# Patient Record
Sex: Male | Born: 1950 | Race: White | Hispanic: No | Marital: Married | Smoking: Current some day smoker
Health system: Southern US, Community
[De-identification: ages and names within clinical notes are randomized; demographics above are authoritative.]

## PROBLEM LIST (undated history)

## (undated) DIAGNOSIS — I10 Essential (primary) hypertension: Secondary | ICD-10-CM

## (undated) DIAGNOSIS — K5792 Diverticulitis of intestine, part unspecified, without perforation or abscess without bleeding: Secondary | ICD-10-CM

## (undated) DIAGNOSIS — E7800 Pure hypercholesterolemia, unspecified: Secondary | ICD-10-CM

## (undated) DIAGNOSIS — N200 Calculus of kidney: Secondary | ICD-10-CM

## (undated) HISTORY — DX: Calculus of kidney: N20.0

## (undated) HISTORY — DX: Diverticulitis of intestine, part unspecified, without perforation or abscess without bleeding: K57.92

## (undated) HISTORY — PX: APPENDECTOMY: SHX54

## (undated) HISTORY — PX: OTHER SURGICAL HISTORY: SHX169

---

## 2012-11-01 DIAGNOSIS — E785 Hyperlipidemia, unspecified: Secondary | ICD-10-CM | POA: Diagnosis present

## 2013-06-13 ENCOUNTER — Emergency Department (HOSPITAL_BASED_OUTPATIENT_CLINIC_OR_DEPARTMENT_OTHER)
Admission: EM | Admit: 2013-06-13 | Discharge: 2013-06-13 | Disposition: A | Discharge status: Home / Self Care | Payer: BC Managed Care – PPO | Attending: Emergency Medicine | Admitting: Emergency Medicine

## 2013-06-13 ENCOUNTER — Emergency Department (HOSPITAL_BASED_OUTPATIENT_CLINIC_OR_DEPARTMENT_OTHER): Payer: BC Managed Care – PPO

## 2013-06-13 ENCOUNTER — Encounter (HOSPITAL_BASED_OUTPATIENT_CLINIC_OR_DEPARTMENT_OTHER): Payer: Self-pay | Admitting: Emergency Medicine

## 2013-06-13 DIAGNOSIS — Z862 Personal history of diseases of the blood and blood-forming organs and certain disorders involving the immune mechanism: Secondary | ICD-10-CM | POA: Insufficient documentation

## 2013-06-13 DIAGNOSIS — Z880 Allergy status to penicillin: Secondary | ICD-10-CM | POA: Insufficient documentation

## 2013-06-13 DIAGNOSIS — R42 Dizziness and giddiness: Secondary | ICD-10-CM | POA: Insufficient documentation

## 2013-06-13 DIAGNOSIS — R52 Pain, unspecified: Secondary | ICD-10-CM | POA: Insufficient documentation

## 2013-06-13 DIAGNOSIS — R079 Chest pain, unspecified: Secondary | ICD-10-CM

## 2013-06-13 DIAGNOSIS — R51 Headache: Secondary | ICD-10-CM | POA: Insufficient documentation

## 2013-06-13 DIAGNOSIS — R0789 Other chest pain: Secondary | ICD-10-CM | POA: Insufficient documentation

## 2013-06-13 DIAGNOSIS — Z8639 Personal history of other endocrine, nutritional and metabolic disease: Secondary | ICD-10-CM | POA: Insufficient documentation

## 2013-06-13 DIAGNOSIS — R0602 Shortness of breath: Secondary | ICD-10-CM | POA: Insufficient documentation

## 2013-06-13 HISTORY — DX: Essential (primary) hypertension: I10

## 2013-06-13 HISTORY — DX: Pure hypercholesterolemia, unspecified: E78.00

## 2013-06-13 LAB — TROPONIN I: Troponin I: <0.3 ng/mL (ref <0.30)

## 2013-06-13 LAB — COMPREHENSIVE METABOLIC PANEL
ALBUMIN: 3.9 g/dL (ref 3.5–5.2)
ALK PHOS: 64 U/L (ref 39–117)
ALT: 19 U/L (ref 0–53)
AST: 19 U/L (ref 0–37)
BUN: 14 mg/dL (ref 6–23)
CO2: 23 mEq/L (ref 19–32)
CREATININE: 1 mg/dL (ref 0.50–1.35)
Calcium: 9.8 mg/dL (ref 8.4–10.5)
Chloride: 101 mEq/L (ref 96–112)
GFR calc Af Amer: >90 mL/min (ref >90)
GFR calc non Af Amer: 79 mL/min — ABNORMAL LOW (ref >90)
Glucose, Bld: 122 mg/dL — ABNORMAL HIGH (ref 70–99)
POTASSIUM: 4.1 meq/L (ref 3.7–5.3)
Sodium: 140 mEq/L (ref 137–147)
Total Bilirubin: 0.6 mg/dL (ref 0.3–1.2)
Total Protein: 7 g/dL (ref 6.0–8.3)

## 2013-06-13 LAB — CBC
HEMATOCRIT: 44.8 % (ref 39.0–52.0)
HEMOGLOBIN: 15.3 g/dL (ref 13.0–17.0)
MCH: 30.7 pg (ref 26.0–34.0)
MCHC: 34.2 g/dL (ref 30.0–36.0)
MCV: 90 fL (ref 78.0–100.0)
Platelets: 197 10*3/uL (ref 150–400)
RBC: 4.98 MIL/uL (ref 4.22–5.81)
RDW: 13.2 % (ref 11.5–15.5)
WBC: 8.4 10*3/uL (ref 4.0–10.5)

## 2013-06-13 MED ORDER — ASPIRIN 81 MG PO CHEW: 81.0000 mg | CHEWABLE_TABLET | Freq: Every day | ORAL | Status: DC

## 2013-06-13 MED ORDER — LORAZEPAM 1 MG PO TABS
1.0000 mg | ORAL_TABLET | Freq: Once | ORAL | Status: AC
  Administered 2013-06-13: 1 mg via ORAL
  Filled 2013-06-13: qty 1

## 2013-06-13 NOTE — Discharge Instructions (Signed)
As discussed, it is important that you follow up with our cardiologists for further evaluation and management.  In addition, please be sure to speak with your primary care physician.  Chest Pain (Nonspecific) It is often hard to give a specific diagnosis for the cause of chest pain. There is always a chance that your pain could be related to something serious, such as a heart attack or a blood clot in the lungs. You need to follow up with your caregiver for further evaluation. CAUSES   Heartburn.  Pneumonia or bronchitis.  Anxiety or stress.  Inflammation around your heart (pericarditis) or lung (pleuritis or pleurisy).  A blood clot in the lung.  A collapsed lung (pneumothorax). It can develop suddenly on its own (spontaneous pneumothorax) or from injury (trauma) to the chest.  Shingles infection (herpes zoster virus). The chest wall is composed of bones, muscles, and cartilage. Any of these can be the source of the pain.  The bones can be bruised by injury.  The muscles or cartilage can be strained by coughing or overwork.  The cartilage can be affected by inflammation and become sore (costochondritis). DIAGNOSIS  Lab tests or other studies, such as X-rays, electrocardiography, stress testing, or cardiac imaging, may be needed to find the cause of your pain.  TREATMENT   Treatment depends on what may be causing your chest pain. Treatment may include:  Acid blockers for heartburn.  Anti-inflammatory medicine.  Pain medicine for inflammatory conditions.  Antibiotics if an infection is present.  You may be advised to change lifestyle habits. This includes stopping smoking and avoiding alcohol, caffeine, and chocolate.  You may be advised to keep your head raised (elevated) when sleeping. This reduces the chance of acid going backward from your stomach into your esophagus.  Most of the time, nonspecific chest pain will improve within 2 to 3 days with rest and mild pain  medicine. HOME CARE INSTRUCTIONS   If antibiotics were prescribed, take your antibiotics as directed. Finish them even if you start to feel better.  For the next few days, avoid physical activities that bring on chest pain. Continue physical activities as directed.  Do not smoke.  Avoid drinking alcohol.  Only take over-the-counter or prescription medicine for pain, discomfort, or fever as directed by your caregiver.  Follow your caregiver's suggestions for further testing if your chest pain does not go away.  Keep any follow-up appointments you made. If you do not go to an appointment, you could develop lasting (chronic) problems with pain. If there is any problem keeping an appointment, you must call to reschedule. SEEK MEDICAL CARE IF:   You think you are having problems from the medicine you are taking. Read your medicine instructions carefully.  Your chest pain does not go away, even after treatment.  You develop a rash with blisters on your chest. SEEK IMMEDIATE MEDICAL CARE IF:   You have increased chest pain or pain that spreads to your arm, neck, jaw, back, or abdomen.  You develop shortness of breath, an increasing cough, or you are coughing up blood.  You have severe back or abdominal pain, feel nauseous, or vomit.  You develop severe weakness, fainting, or chills.  You have a fever. THIS IS AN EMERGENCY. Do not wait to see if the pain will go away. Get medical help at once. Call your local emergency services (911 in [place] Do not drive yourself to the hospital. MAKE SURE YOU:   Understand these instructions.  Will watch  your condition.  Will get help right away if you are not doing well or get worse. Document Released: 02/21/2005 Document Revised: 08/06/2011 Document Reviewed: 12/18/2007 [facility] Patient Information 2014 [place].  1 return here for concerning changes in your condition.

## 2013-06-13 NOTE — ED Provider Notes (Signed)
CSN: [id number]     Arrival date & time 06/13/13  0944 History   First MD Initiated Contact with Patient 06/13/13 1001     Chief Complaint  Patient presents with  . Chest Pain  . Shortness of Breath  . Headache   (Consider location/radiation/quality/duration/timing/severity/associated sxs/prior Treatment) HPI Patient presents after the sudden onset of chest pressure or dyspnea. Onset was at approximately one hour prior to my evaluation.  Patient recalls that after receiving bad news via telephone he felt the sudden onset of symptoms. In addition to the pressure and dyspnea, there was mild lightheadedness, generalized discomfort. Symptoms have improved nearly entirely prior to my evaluation. Patient did take aspirin. He denies history of heart disease, stroke, blood clots. He does smoke, drink.  Past Medical History  Diagnosis Date  . Hypercholesteremia   . Hypertension    No past surgical history on file. No family history on file. History  Substance Use Topics  . Smoking status: Never Smoker   . Smokeless tobacco: Not on file  . Alcohol Use: Not on file    Review of Systems  Constitutional:       Per HPI, otherwise negative  HENT:       Per HPI, otherwise negative  Respiratory:       Per HPI, otherwise negative  Cardiovascular:       Per HPI, otherwise negative  Gastrointestinal: Negative for vomiting.  Endocrine:       Negative aside from HPI  Genitourinary:       Neg aside from HPI   Musculoskeletal:       Per HPI, otherwise negative  Skin: Negative.   Neurological: Negative for syncope.    Allergies  Penicillins  Home Medications   Current Outpatient Rx  Name  Route  Sig  Dispense  Refill  . ibuprofen (ADVIL,MOTRIN) 600 MG tablet   Oral   Take 600 mg by mouth every 6 (six) hours as needed.          BP 174/87  Pulse 78  Temp(Src) 97.7 F (36.5 C) (Oral)  Resp 16  Ht 5\' 8"  (1.727 m)  Wt 250 lb (113.399 kg)  BMI 38.02 kg/m2  SpO2  100% Physical Exam  Nursing note and vitals reviewed. Constitutional: He is oriented to person, place, and time. He appears well-developed. No distress.  HENT:  Head: Normocephalic and atraumatic.  Eyes: Conjunctivae and EOM are normal.  Cardiovascular: Normal rate and regular rhythm.   Pulmonary/Chest: Effort normal. No stridor. No respiratory distress.  Abdominal: He exhibits no distension.  Musculoskeletal: He exhibits no edema.  Neurological: He is alert and oriented to person, place, and time.  Skin: Skin is warm and dry.  Psychiatric: He has a normal mood and affect.    ED Course  Procedures (including critical care time) Labs Review Labs Reviewed  COMPREHENSIVE METABOLIC PANEL  CBC  TROPONIN I   Imaging Review No results found.  EKG Interpretation    Date/Time:  Saturday June 13 2013 09:50:01 EST Ventricular Rate:  81 PR Interval:  114 QRS Duration: 82 QT Interval:  370 QTC Calculation: 429 R Axis:   96 Text Interpretation:  Normal sinus rhythm Rightward axis Pulmonary disease pattern T wave abnormality, consider lateral ischemia Abnormal ECG Sinus rhythm Rightward axis T wave abnormality Abnormal ekg Confirmed by Carmin Muskrat  MD ([id number]) on 06/13/2013 10:18:05 AM           3:19 PM Patient in no distress.  He is aware  of all results.  We reviewed the x-ray together, will results together, the need for followup together. MDM  No diagnosis found. Patient presents with chest pain that began after receiving bad news.  Patient's symptoms resolved entirely prior to my evaluation, did not recur here.  The patient smokes.  Patient's evaluation here is largely reassuring.  At all times with patient and his wife about the need for further evaluation, management as an outpatient.  Given the absence of distress, the reassuring labs, he was discharged in stable condition to follow up with primary care and cardiology    Carmin Muskrat, MD 06/13/13 1520

## 2013-06-13 NOTE — ED Notes (Signed)
Left sided chest tightness, some sob, some numbness and tingling in hands, started one hour ago after getting bad news.    Denies N/V.  Pt feels very anxious.

## 2014-03-21 ENCOUNTER — Emergency Department (HOSPITAL_BASED_OUTPATIENT_CLINIC_OR_DEPARTMENT_OTHER): Payer: BC Managed Care – PPO

## 2014-03-21 ENCOUNTER — Encounter (HOSPITAL_BASED_OUTPATIENT_CLINIC_OR_DEPARTMENT_OTHER): Payer: Self-pay | Admitting: Emergency Medicine

## 2014-03-21 ENCOUNTER — Emergency Department (HOSPITAL_BASED_OUTPATIENT_CLINIC_OR_DEPARTMENT_OTHER)
Admission: EM | Admit: 2014-03-21 | Discharge: 2014-03-21 | Disposition: A | Discharge status: Home / Self Care | Payer: BC Managed Care – PPO | Attending: Emergency Medicine | Admitting: Emergency Medicine

## 2014-03-21 DIAGNOSIS — Z8639 Personal history of other endocrine, nutritional and metabolic disease: Secondary | ICD-10-CM | POA: Diagnosis not present

## 2014-03-21 DIAGNOSIS — J029 Acute pharyngitis, unspecified: Secondary | ICD-10-CM | POA: Diagnosis not present

## 2014-03-21 DIAGNOSIS — Z720 Tobacco use: Secondary | ICD-10-CM | POA: Diagnosis not present

## 2014-03-21 DIAGNOSIS — R599 Enlarged lymph nodes, unspecified: Secondary | ICD-10-CM

## 2014-03-21 DIAGNOSIS — I10 Essential (primary) hypertension: Secondary | ICD-10-CM | POA: Diagnosis not present

## 2014-03-21 DIAGNOSIS — Z880 Allergy status to penicillin: Secondary | ICD-10-CM | POA: Insufficient documentation

## 2014-03-21 DIAGNOSIS — R52 Pain, unspecified: Secondary | ICD-10-CM

## 2014-03-21 DIAGNOSIS — K088 Other specified disorders of teeth and supporting structures: Secondary | ICD-10-CM | POA: Diagnosis present

## 2014-03-21 DIAGNOSIS — Z7982 Long term (current) use of aspirin: Secondary | ICD-10-CM | POA: Diagnosis not present

## 2014-03-21 DIAGNOSIS — R221 Localized swelling, mass and lump, neck: Secondary | ICD-10-CM

## 2014-03-21 LAB — CBC WITH DIFFERENTIAL/PLATELET
BASOS ABS: 0 10*3/uL (ref 0.0–0.1)
BASOS PCT: 0 % (ref 0–1)
Eosinophils Absolute: 0.1 10*3/uL (ref 0.0–0.7)
Eosinophils Relative: 1 % (ref 0–5)
HEMATOCRIT: 46 % (ref 39.0–52.0)
HEMOGLOBIN: 15.6 g/dL (ref 13.0–17.0)
LYMPHS PCT: 11 % — AB (ref 12–46)
Lymphs Abs: 1.3 10*3/uL (ref 0.7–4.0)
MCH: 31.1 pg (ref 26.0–34.0)
MCHC: 33.9 g/dL (ref 30.0–36.0)
MCV: 91.6 fL (ref 78.0–100.0)
MONO ABS: 0.9 10*3/uL (ref 0.1–1.0)
MONOS PCT: 8 % (ref 3–12)
Neutro Abs: 9.7 10*3/uL — ABNORMAL HIGH (ref 1.7–7.7)
Neutrophils Relative %: 80 % — ABNORMAL HIGH (ref 43–77)
Platelets: 182 10*3/uL (ref 150–400)
RBC: 5.02 MIL/uL (ref 4.22–5.81)
RDW: 13.5 % (ref 11.5–15.5)
WBC: 12.1 10*3/uL — AB (ref 4.0–10.5)

## 2014-03-21 LAB — BASIC METABOLIC PANEL
Anion gap: 14 (ref 5–15)
BUN: 18 mg/dL (ref 6–23)
CHLORIDE: 104 meq/L (ref 96–112)
CO2: 23 meq/L (ref 19–32)
Calcium: 10.4 mg/dL (ref 8.4–10.5)
Creatinine, Ser: 0.8 mg/dL (ref 0.50–1.35)
GFR calc non Af Amer: >90 mL/min (ref >90)
Glucose, Bld: 133 mg/dL — ABNORMAL HIGH (ref 70–99)
Potassium: 4.4 mEq/L (ref 3.7–5.3)
SODIUM: 141 meq/L (ref 137–147)

## 2014-03-21 LAB — RAPID STREP SCREEN (MED CTR MEBANE ONLY): STREPTOCOCCUS, GROUP A SCREEN (DIRECT): NEGATIVE

## 2014-03-21 MED ORDER — IOHEXOL 300 MG/ML  SOLN: 100.0000 mL | Freq: Once | INTRAMUSCULAR | Status: DC | PRN

## 2014-03-21 MED ORDER — FENTANYL CITRATE 0.05 MG/ML IJ SOLN
50.0000 ug | Freq: Once | INTRAMUSCULAR | Status: AC
  Administered 2014-03-21: 50 ug via INTRAVENOUS
  Filled 2014-03-21: qty 2

## 2014-03-21 MED ORDER — KETOROLAC TROMETHAMINE 30 MG/ML IJ SOLN
30.0000 mg | Freq: Once | INTRAMUSCULAR | Status: AC
  Administered 2014-03-21: 30 mg via INTRAVENOUS
  Filled 2014-03-21: qty 1

## 2014-03-21 MED ORDER — CLINDAMYCIN PHOSPHATE 600 MG/50ML IV SOLN
600.0000 mg | Freq: Once | INTRAVENOUS | Status: AC
  Administered 2014-03-21: 600 mg via INTRAVENOUS
  Filled 2014-03-21: qty 50

## 2014-03-21 MED ORDER — CLINDAMYCIN HCL 300 MG PO CAPS: 300.0000 mg | ORAL_CAPSULE | Freq: Four times a day (QID) | ORAL | Status: DC

## 2014-03-21 MED ORDER — HYDRALAZINE HCL 20 MG/ML IJ SOLN
2.0000 mg | Freq: Once | INTRAMUSCULAR | Status: AC
  Administered 2014-03-21: 2 mg via INTRAVENOUS
  Filled 2014-03-21: qty 1

## 2014-03-21 MED ORDER — DEXAMETHASONE SODIUM PHOSPHATE 4 MG/ML IJ SOLN
10.0000 mg | Freq: Once | INTRAMUSCULAR | Status: AC
  Administered 2014-03-21: 10 mg via INTRAVENOUS
  Filled 2014-03-21: qty 3

## 2014-03-21 MED ORDER — HYDROCODONE-ACETAMINOPHEN 7.5-325 MG/15ML PO SOLN: 15.0000 mL | Freq: Four times a day (QID) | ORAL | Status: AC | PRN

## 2014-03-21 NOTE — ED Notes (Signed)
Pt reports nasal dripand cough that lead to oral swelling and pain 10/10

## 2014-03-21 NOTE — ED Provider Notes (Signed)
Care was taken over from Dr. Nicholes Stairs. Patient presented with sore throat and difficulty swallowing with swelling to the left side of his neck. Patient and his wife did agree to get the CT scan. This was done noncontrast given that he has an IV contrast allergy. The noncontrast CT showed some edema in the tonsillar area extending to the arytenoid. There is no airway compromise. These findings were discussed with the radiologist. There is no definite abscess collection. Patient is well-appearing at this point. His voice is at baseline per report. He has no difficulty controlling secretions. He was given a dose of IV clindamycin in the ED. He previously been given Decadron. I feel comfortable with him going home at this point. He was discharged and advised to follow-up with ENT, Dr. Constance Holster if his symptoms are not improving in next 1-2 days. I advised him return to the emergency department if he has any worsening throat swelling or difficulty swallowing. He is given prescriptions for clindamycin as well as Lortab elixir.  Results for orders placed during the hospital encounter of 03/21/14  RAPID STREP SCREEN      Result Value Ref Range   Streptococcus, Group A Screen (Direct) NEGATIVE  NEGATIVE  BASIC METABOLIC PANEL      Result Value Ref Range   Sodium 141  137 - 147 mEq/L   Potassium 4.4  3.7 - 5.3 mEq/L   Chloride 104  96 - 112 mEq/L   CO2 23  19 - 32 mEq/L   Glucose, Bld 133 (*) 70 - 99 mg/dL   BUN 18  6 - 23 mg/dL   Creatinine, Ser 0.80  0.50 - 1.35 mg/dL   Calcium 10.4  8.4 - 10.5 mg/dL   GFR calc non Af Amer >90  >90 mL/min   GFR calc Af Amer >90  >90 mL/min   Anion gap 14  5 - 15  CBC WITH DIFFERENTIAL      Result Value Ref Range   WBC 12.1 (*) 4.0 - 10.5 K/uL   RBC 5.02  4.22 - 5.81 MIL/uL   Hemoglobin 15.6  13.0 - 17.0 g/dL   HCT 46.0  39.0 - 52.0 %   MCV 91.6  78.0 - 100.0 fL   MCH 31.1  26.0 - 34.0 pg   MCHC 33.9  30.0 - 36.0 g/dL   RDW 13.5  11.5 - 15.5 %   Platelets 182  150 -  400 K/uL   Neutrophils Relative % 80 (*) 43 - 77 %   Neutro Abs 9.7 (*) 1.7 - 7.7 K/uL   Lymphocytes Relative 11 (*) 12 - 46 %   Lymphs Abs 1.3  0.7 - 4.0 K/uL   Monocytes Relative 8  3 - 12 %   Monocytes Absolute 0.9  0.1 - 1.0 K/uL   Eosinophils Relative 1  0 - 5 %   Eosinophils Absolute 0.1  0.0 - 0.7 K/uL   Basophils Relative 0  0 - 1 %   Basophils Absolute 0.0  0.0 - 0.1 K/uL   Ct Soft Tissue Neck Wo Contrast  03/21/2014   CLINICAL DATA:  Throat swelling. Left-sided neck swelling. Pain. Elevated white count. Allergy to IV contrast.  EXAM: CT NECK WITHOUT CONTRAST  TECHNIQUE: Multidetector CT imaging of the neck was performed following the standard protocol without intravenous contrast.  COMPARISON:  None.  FINDINGS: The left palatine tonsil region is enlarged and inflamed. There are inflammatory changes extending into the adjacent fat planes including the parapharyngeal  fat. No discrete mass is evident. Edematous changes extend into the left vallecula and aryepiglottic fold. The airway is patent there shifted slightly to the right. The vallecula and right-sided the epiglottis are normal. The left submandibular gland is slightly hyperdense to the right with some inflammatory change. This is likely secondary. No obstructing stones are present.  The parotid glands are within normal limits bilaterally.  The larynx is within normal limits. Vocal cords are midline and symmetric. No focal mucosal or submucosal lesions are present. The cartilage is intact.  The thyroid is normal. Trachea esophagus are within normal limits. The superior mediastinum is unremarkable.  Reactive sized level 2 lymph nodes are more prominent on the left.  Although dental caries are present within the left mandibular teeth, there no significant periapical disease or osseous erosion. The bone windows demonstrate mild endplate degenerative changes at C4-5, C5-6, and C6-7 with some uncovertebral spurring at each of these levels.  Multilevel facet degenerative changes are noted as well.  IMPRESSION: 1. Inflammatory changes of the left palatine tonsil compatible with acute tonsillitis. 2. The tonsil is enlarged with areas of hypoattenuation. No discrete abscess is evident. Sensitivity is decreased without IV contrast. 3. Inflammatory changes surrounding the left tonsil with edematous changes extending into the left vallecula and aryepiglottic fold. No discrete mass is present. 4. Asymmetric density inflammatory change of the left submandibular gland is likely secondary. No focal duct dilation or stones are evident. 5. Left level 2 lymph nodes appear reactive.  These results were called by telephone at the time of interpretation on 03/21/2014 at 10:33 am to Dr. Threasa Beards Zelina Jimerson , who verbally acknowledged these results.   Electronically Signed   By: Lawrence Santiago M.D.   On: 03/21/2014 10:33      Malvin Johns, MD 03/21/14 1157

## 2014-03-21 NOTE — ED Provider Notes (Signed)
CSN: [id number]     Arrival date & time 03/21/14  1287 History   None    Chief Complaint  Patient presents with  . Oral Swelling     (Consider location/radiation/quality/duration/timing/severity/associated sxs/prior Treatment) Patient is a 63 y.o. male presenting with tooth pain. The history is provided by the patient. No language interpreter was used.  Dental Pain Location:  Lower Quality:  Aching Severity:  Severe Onset quality:  Gradual Timing:  Constant Progression:  Worsening Chronicity:  New Context: cap still on   Context comment:  Started as tongue pain and submandibular pain on the left and then pain in the lower jaw and teeth Previous work-up:  Dental exam and filled cavity Relieved by:  Nothing Worsened by:  Nothing tried Ineffective treatments:  None tried Associated symptoms: no congestion, no drooling, no facial swelling, no oral bleeding, no oral lesions and no trismus   Risk factors: smoking   Risk factors: no alcohol problem     Past Medical History  Diagnosis Date  . Hypercholesteremia   . Hypertension    Past Surgical History  Procedure Laterality Date  . Appendectomy     History reviewed. No pertinent family history. History  Substance Use Topics  . Smoking status: Current Some Day Smoker  . Smokeless tobacco: Never Used  . Alcohol Use: Yes    Review of Systems  HENT: Negative for congestion, drooling, facial swelling and mouth sores.   Cardiovascular: Negative for chest pain.  All other systems reviewed and are negative.     Allergies  Penicillins  Home Medications   Prior to Admission medications   Medication Sig Start Date End Date Taking? Authorizing Provider  aspirin 81 MG chewable tablet Chew 1 tablet (81 mg total) by mouth daily. 06/13/13   Carmin Muskrat, MD  ibuprofen (ADVIL,MOTRIN) 600 MG tablet Take 600 mg by mouth every 6 (six) hours as needed.    Historical Provider, MD   BP 202/112  Pulse 82  Temp(Src) 97.8 F (36.6  C) (Oral)  Resp 20  SpO2 97% Physical Exam  Constitutional: He is oriented to person, place, and time. He appears well-developed and well-nourished. No distress.  No drooling no trismus laying flat in the bed without difficulty.  No plumy voice.  Intact phonation  HENT:  Head: Normocephalic and atraumatic.  Mouth/Throat: Oropharynx is clear and moist. No oropharyngeal exudate.  No swelling of the lips or tongue or floor of the mouth.  Caries in the LL molars,  Swelling of the salivary  Eyes: Conjunctivae and EOM are normal. Pupils are equal, round, and reactive to light.  Neck: Normal range of motion. Neck supple. No tracheal deviation present.  No pain with displacement of the trachea.    Cardiovascular: Normal rate, regular rhythm and intact distal pulses.   Pulmonary/Chest: Effort normal and breath sounds normal. No stridor. No respiratory distress. He has no wheezes. He has no rales.  Abdominal: Soft. Bowel sounds are normal. There is no tenderness. There is no rebound and no guarding.  Musculoskeletal: Normal range of motion.  Lymphadenopathy:    He has no cervical adenopathy.  Neurological: He is alert and oriented to person, place, and time.  Skin: Skin is warm and dry.  Psychiatric: He has a normal mood and affect.    ED Course  Procedures (including critical care time) Labs Review Labs Reviewed  RAPID STREP SCREEN  CBC WITH DIFFERENTIAL  BASIC METABOLIC PANEL    Imaging Review No results found.  EKG Interpretation None      MDM   Final diagnoses:  Swelling of gland  Per records patient has a h/o HTN  Medications  iohexol (OMNIPAQUE) 300 MG/ML solution 100 mL (not administered)  dexamethasone (DECADRON) injection 10 mg (10 mg Intravenous Given 03/21/14 0641)  ketorolac (TORADOL) 30 MG/ML injection 30 mg (30 mg Intravenous Given 03/21/14 9628)    Case d/w Dr. Weber Cooks.  Originally recommended 13 hour prep and CT with.  Family refusing Ct with contrast  even with prep due to anaphylactic reaction to IV contrast 40 years ago.  Patient's nurse Sunday Spillers present for entirety of the discussion.  Family would prefer MRI.  Case d/w Dr. Weber Cooks.  MRI is a suboptimal test.     Family is still deciding with patient's sister who is a Marine scientist on the phone.  Rechecking vitals.  Have medicated BP.     Have signed out to Dr. Tamera Punt pending CT or family decision   If patient declines CT scan will need to [facility] as the risks of not having the test are death from airway compromise, death from infection   2022/09/25 Alfonso Patten, MD 03/21/14 [phone]

## 2014-03-21 NOTE — ED Notes (Addendum)
Patient reports having an anaphylactic reaction to IV contrast in the past. Patient has been given the options per the EDP and [facility] radiologist for CT w/o contrast vs having a several hour pre-procedure prep vs leaving AMA. The family advised that the patient needs an MRI and not to have a CT scan done at all. The family and the patient  is currently working towards making the decision on what they would like to do.

## 2014-03-21 NOTE — Discharge Instructions (Signed)

## 2014-03-23 LAB — CULTURE, GROUP A STREP

## 2014-07-19 DIAGNOSIS — I10 Essential (primary) hypertension: Secondary | ICD-10-CM | POA: Diagnosis present

## 2014-07-20 DIAGNOSIS — E291 Testicular hypofunction: Secondary | ICD-10-CM | POA: Insufficient documentation

## 2014-09-01 DIAGNOSIS — D751 Secondary polycythemia: Secondary | ICD-10-CM | POA: Insufficient documentation

## 2015-11-21 DIAGNOSIS — Z8619 Personal history of other infectious and parasitic diseases: Secondary | ICD-10-CM | POA: Insufficient documentation

## 2016-02-23 IMAGING — CT CT NECK W/O CM
3 of 5 series · 13 of 35 positions shown, 16 images · non-contrast
Comparison: None.

CLINICAL DATA: Throat swelling. Left-sided neck swelling. Pain.
Elevated white count. Allergy to IV contrast.

EXAM:
CT NECK WITHOUT CONTRAST
TECHNIQUE: Multidetector CT imaging of the neck was performed following the
standard protocol without intravenous contrast.

[Series 4: neck 2.0 coronal · coronal · 0.45mm/px · 3 of 98 slices shown]
[im 20/98  bone]
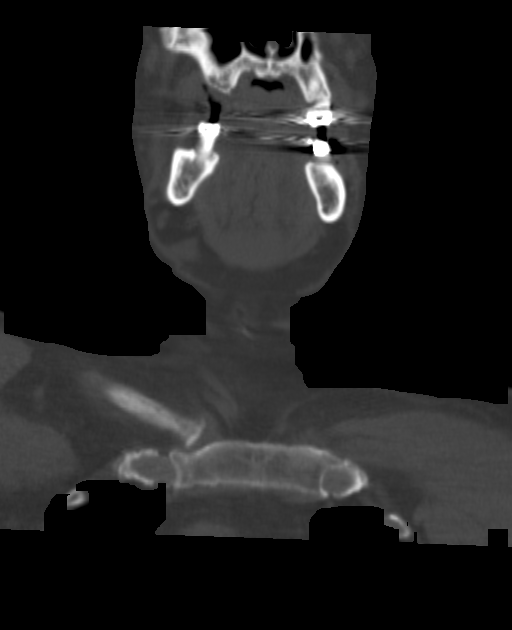
[im 39/98  bone]
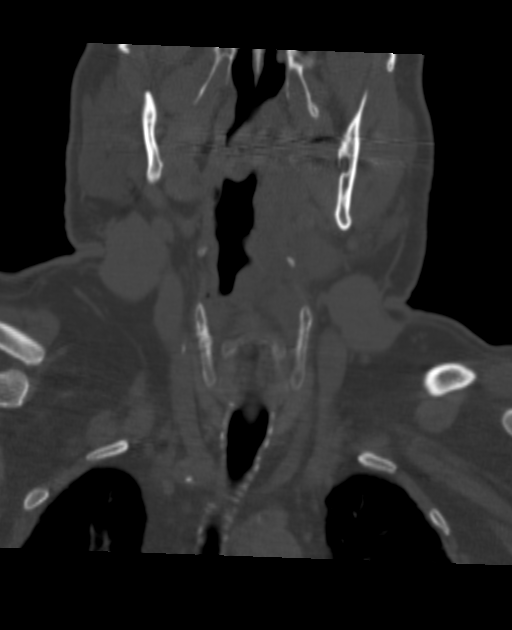
[im 59/98  bone]
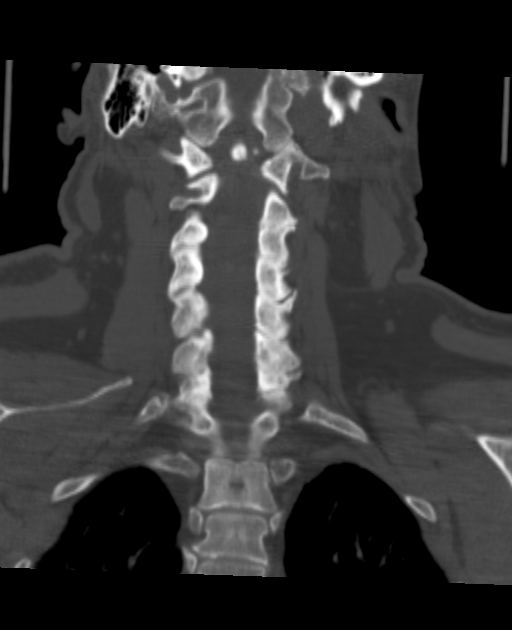

[Series 5: neck 2.0 sagittal · sagittal · 0.45mm/px · 5 of 101 slices shown, 6 images]
[im 34/101  bone]
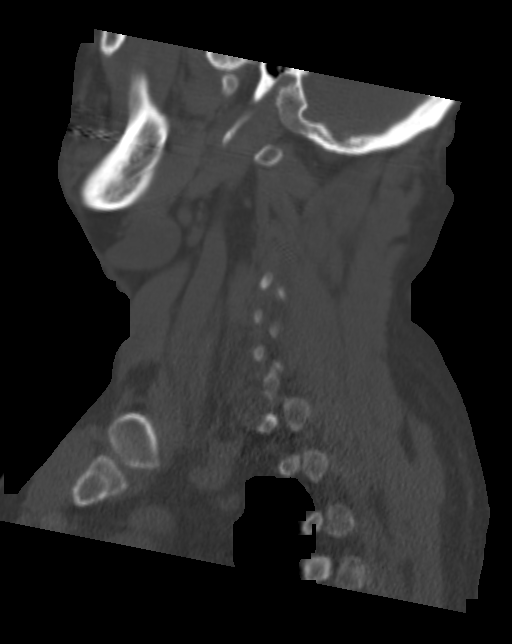
[im 42/101  bone]
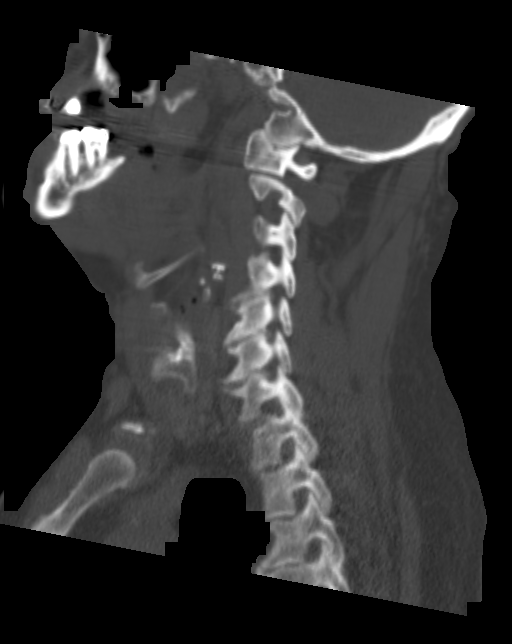
[im 51/101  soft-tissue]
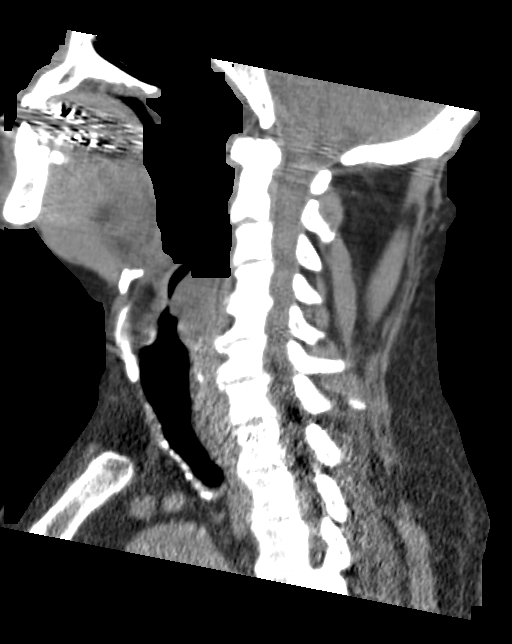
[im 51/101  bone]
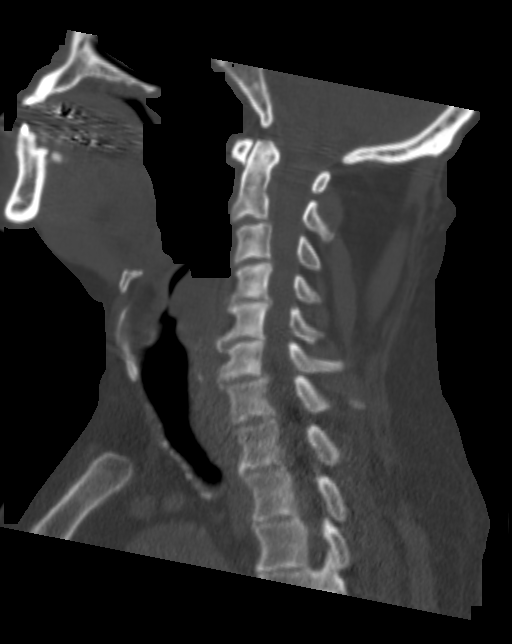
[im 59/101  bone]
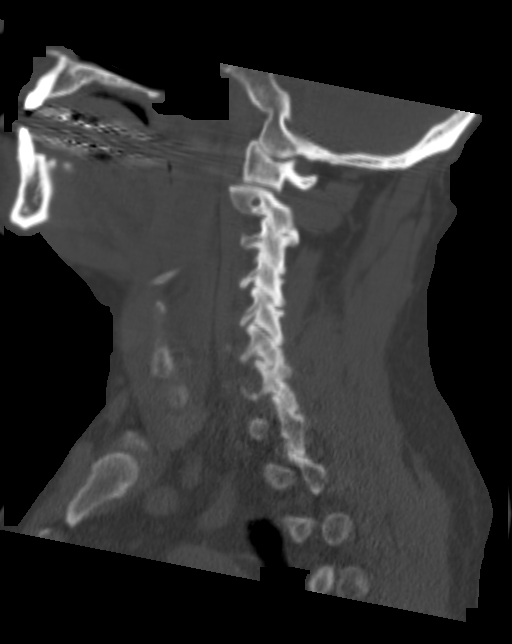
[im 67/101  bone]
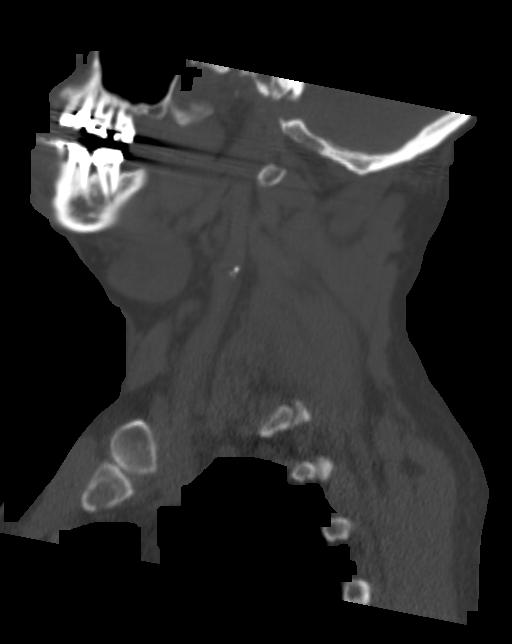

[Series 7: neck 2.0 orth axial to hyoid · axial · 0.39mm/px · z∈[+1028,+1175]mm · 5 of 116 slices shown, 7 images]
[im 20/116  soft-tissue]
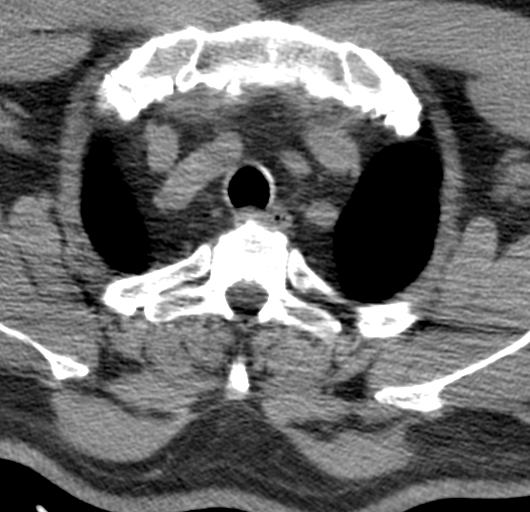
[im 20/116  bone]
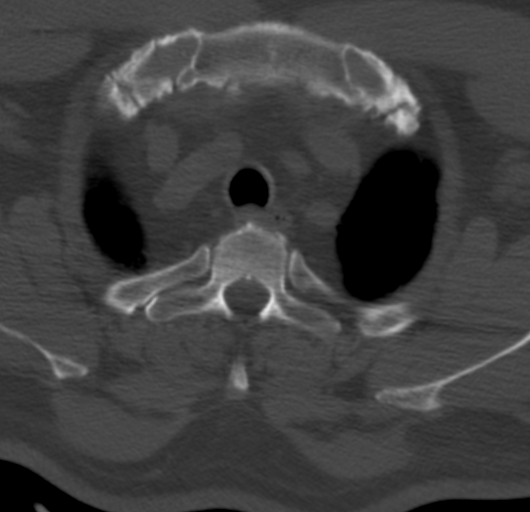
[im 39/116  bone]
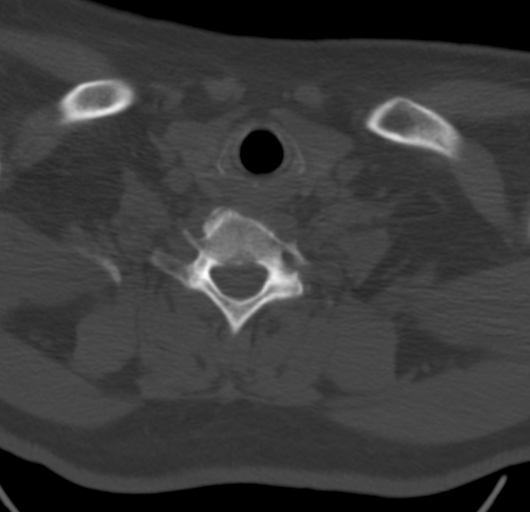
[im 58/116  bone]
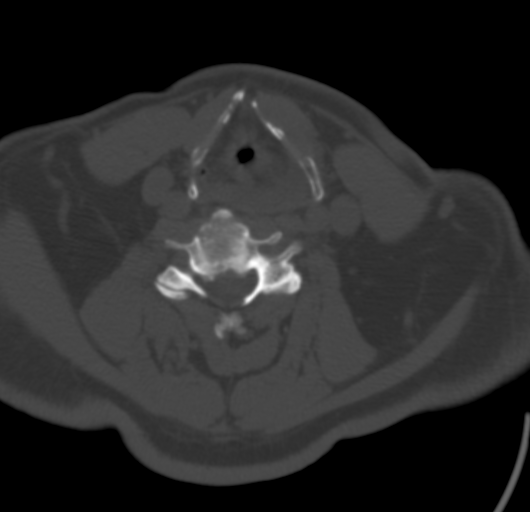
[im 77/116  bone]
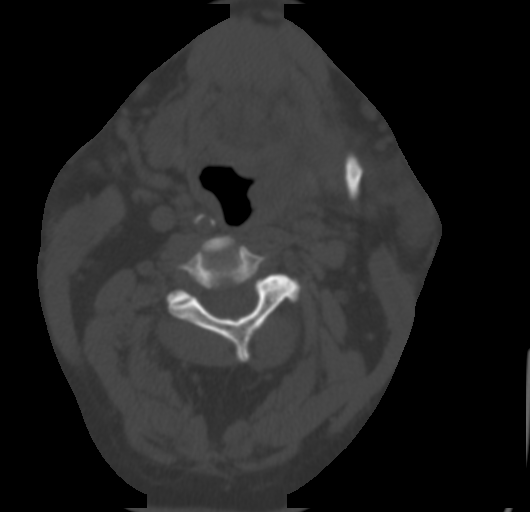
[im 96/116  soft-tissue]
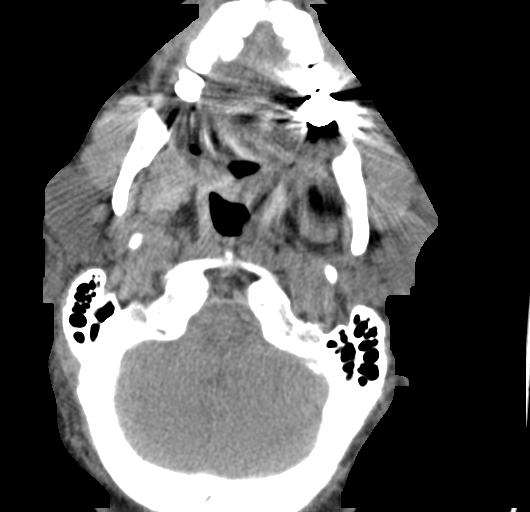
[im 96/116  bone]
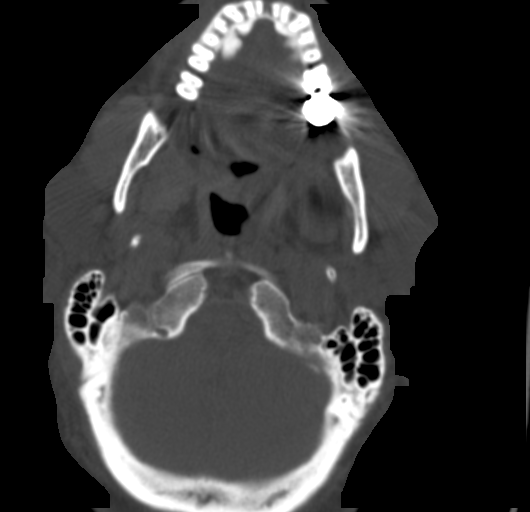

[13 of 35 positions shown; findings below may reference images not displayed]

FINDINGS: The left palatine tonsil region is enlarged and inflamed. There are
inflammatory changes extending into the adjacent fat planes
including the parapharyngeal fat. No discrete mass is evident.
Edematous changes extend into the left vallecula and aryepiglottic
fold. The airway is patent there shifted slightly to the right. The
vallecula and right-sided the epiglottis are normal. The left
submandibular gland is slightly hyperdense to the right with some
inflammatory change. This is likely secondary. No obstructing stones
are present.

The parotid glands are within normal limits bilaterally.

The larynx is within normal limits. Vocal cords are midline and
symmetric. No focal mucosal or submucosal lesions are present. The
cartilage is intact.

The thyroid is normal. Trachea esophagus are within normal limits.
The superior mediastinum is unremarkable.

Reactive sized level 2 lymph nodes are more prominent on the left.

Although dental caries are present within the left mandibular teeth,
there no significant periapical disease or osseous erosion. The bone
windows demonstrate mild endplate degenerative changes at C4-5,
C5-6, and C6-7 with some uncovertebral spurring at each of these
levels. Multilevel facet degenerative changes are noted as well.
IMPRESSION: 1. Inflammatory changes of the left palatine tonsil compatible with
acute tonsillitis.
2. The tonsil is enlarged with areas of hypoattenuation. No discrete
abscess is evident. Sensitivity is decreased without IV contrast.
3. Inflammatory changes surrounding the left tonsil with edematous
changes extending into the left vallecula and aryepiglottic fold. No
discrete mass is present.
4. Asymmetric density inflammatory change of the left submandibular
gland is likely secondary. No focal duct dilation or stones are
evident.
5. Left level 2 lymph nodes appear reactive.

These results were called by telephone at the time of interpretation
on 03/21/2014 at [DATE] to Dr. DAO CEBREROS , who verbally
acknowledged these results.

## 2016-12-26 DIAGNOSIS — R7303 Prediabetes: Secondary | ICD-10-CM | POA: Diagnosis present

## 2019-07-06 DIAGNOSIS — N200 Calculus of kidney: Secondary | ICD-10-CM | POA: Insufficient documentation

## 2019-07-06 DIAGNOSIS — K5792 Diverticulitis of intestine, part unspecified, without perforation or abscess without bleeding: Secondary | ICD-10-CM | POA: Insufficient documentation

## 2019-07-06 DIAGNOSIS — K5730 Diverticulosis of large intestine without perforation or abscess without bleeding: Secondary | ICD-10-CM | POA: Insufficient documentation

## 2019-07-14 ENCOUNTER — Encounter: Payer: Self-pay | Admitting: Gastroenterology

## 2019-07-19 ENCOUNTER — Ambulatory Visit: Payer: Medicare HMO | Attending: Internal Medicine

## 2019-07-19 DIAGNOSIS — Z23 Encounter for immunization: Secondary | ICD-10-CM | POA: Insufficient documentation

## 2019-07-19 NOTE — Progress Notes (Signed)
   Covid-19 Vaccination Clinic  Name:  Steven Fisher    MRN: [id number] DOB: [birth year]  07/19/2019  [name] was observed post Covid-19 immunization for 30 minutes based on pre-vaccination screening without incidence. He was provided with Vaccine Information Sheet and instruction to access the V-Safe system.   [name] was instructed to call 911 with any severe reactions post vaccine: Marland Kitchen Difficulty breathing  . Swelling of your face and throat  . A fast heartbeat  . A bad rash all over your body  . Dizziness and weakness    Immunizations Administered    Name Date Dose VIS Date Route   Pfizer COVID-19 Vaccine 07/19/2019  8:29 AM 0.3 mL 05/08/2019 Intramuscular   Manufacturer: [place]   Lot: [id number]   [place]: [id number]

## 2019-08-03 ENCOUNTER — Other Ambulatory Visit: Payer: Self-pay

## 2019-08-10 ENCOUNTER — Other Ambulatory Visit: Payer: Self-pay

## 2019-08-10 ENCOUNTER — Ambulatory Visit: Payer: Medicare HMO | Admitting: Gastroenterology

## 2019-08-10 ENCOUNTER — Encounter: Payer: Self-pay | Admitting: Gastroenterology

## 2019-08-10 VITALS — BP 130/86 | HR 73 | Temp 97.6°F | Ht 68.0 in | Wt 190.0 lb

## 2019-08-10 DIAGNOSIS — K5732 Diverticulitis of large intestine without perforation or abscess without bleeding: Secondary | ICD-10-CM

## 2019-08-10 DIAGNOSIS — K5900 Constipation, unspecified: Secondary | ICD-10-CM | POA: Diagnosis not present

## 2019-08-10 DIAGNOSIS — K649 Unspecified hemorrhoids: Secondary | ICD-10-CM

## 2019-08-10 MED ORDER — SUPREP BOWEL PREP KIT 17.5-3.13-1.6 GM/177ML PO SOLN: ORAL | 0 refills | Status: DC

## 2019-08-10 MED ORDER — POLYETHYLENE GLYCOL 3350 17 G PO PACK: 17.0000 g | PACK | Freq: Every day | ORAL | 0 refills | Status: AC

## 2019-08-10 NOTE — Patient Instructions (Addendum)
If you are age 68 or older, your body mass index should be between 23-30. Your Body mass index is 28.89 kg/m. If this is out of the aforementioned range listed, please consider follow up with your Primary Care Provider.  If you are age 40 or younger, your body mass index should be between 19-25. Your Body mass index is 28.89 kg/m. If this is out of the aformentioned range listed, please consider follow up with your Primary Care Provider.   You have been scheduled for a colonoscopy. Please follow written instructions given to you at your visit today.  Please pick up your prep supplies at the pharmacy within the next 1-3 days. If you use inhalers (even only as needed), please bring them with you on the day of your procedure. Your physician has requested that you go to www.startemmi.com and enter the access code given to you at your visit today. This web site gives a general overview about your procedure. However, you should still follow specific instructions given to you by our office regarding your preparation for the procedure.  Please purchase the following medications over the counter and take as directed: Miralax : Take daily as directed  Thank you for entrusting me with your care and for choosing [place], Dr. Buckingham Cellar   Due to recent changes in healthcare laws, you may see the results of your imaging and laboratory studies on MyChart before your provider has had a chance to review them.  We understand that in some cases there may be results that are confusing or concerning to you. Not all laboratory results come back in the same time frame and the provider may be waiting for multiple results in order to interpret others.  Please give [place] 48 hours in order for your provider to thoroughly review all the results before contacting the office for clarification of your results.

## 2019-08-10 NOTE — Progress Notes (Signed)
HPI :  69 year old male with a history of hypertension, hypercholesterolemia, suspected diverticulitis, referred here by Laurel Dimmer MD for diverticulitis.  On February 8 the patient was seen for severe acute onset left lower quadrant pain.  Patient states this felt unlike anything he has experienced before.  He has a history of renal stones but this apparently was much different from that and severe.  CT scan showed diverticulitis of the distal descending colon.  He incidentally had renal stones and spinal stenosis.  He was given some antibiotics and eventually his symptoms improved.  He states he has had no pain in recent weeks.  This is the first time he is ever had diverticulitis before.  His last colonoscopy was in 2018 and report of scattered rare diverticula were noted.  No adenomas or precancerous polyps on that exam.  He says at baseline he has hard stools and constipation that really bothers him and aggravates his hemorrhoids.  He does not like taking medication so he does not take much for this.  He was given some MiraLAX but states it is in his house but he does not use it.  He has grade 2-3 hemorrhoids that bother him.  He has a lot of discomfort with bowel movements after words, and some occasional bleeding.  He is interested in pursuing some type of therapy for the hemorrhoids.  He has no family history of colon cancer.    CT abdomen pelvis 07/06/19 - diverticulitis of the distal descending colon, left renal stones, spinal stenosis,   Colonoscopy [facility] Date of Surgery: 11/21/2016  Pre-op Diagnosis: History of colon polyps  Key Findings: Benign-appearing colorectal polyps. Rare scattered diverticulosis without inflammation. Minimal complex hemorrhoids with several hypertrophied anal papillae and perianal epithelial cholesterol deposits.   Path - hyperplastic polyps    Past Medical History:  Diagnosis Date  . Diverticulitis   . Hypercholesteremia   .  Hypertension   . Renal stones      Past Surgical History:  Procedure Laterality Date  . APPENDECTOMY    . orthopedic surgeries     multiple   Family History  Problem Relation Age of Onset  . Heart attack Maternal Grandfather   . Heart disease Paternal Grandfather    Social History   Tobacco Use  . Smoking status: Current Some Day Smoker  . Smokeless tobacco: Never Used  Substance Use Topics  . Alcohol use: Yes  . Drug use: No   Current Outpatient Medications  Medication Sig Dispense Refill  . hydrochlorothiazide (HYDRODIURIL) 25 MG tablet Take by mouth.    Marland Kitchen LORazepam (ATIVAN) 1 MG tablet Take by mouth.    . losartan (COZAAR) 100 MG tablet Take 100 mg by mouth daily.    Manus Gunning BOWEL PREP KIT 17.5-3.13-1.6 GM/177ML SOLN Suprep-Use as directed 354 mL 0   No current facility-administered medications for this visit.   Allergies  Allergen Reactions  . Iohexol Anaphylaxis    Had contrast x 20 years ago, throat closed up  . Penicillins Anaphylaxis     Review of Systems: All systems reviewed and negative except where noted in HPI.   Lab Results  Component Value Date   WBC 12.1 (H) 03/21/2014   HGB 15.6 03/21/2014   HCT 46.0 03/21/2014   MCV 91.6 03/21/2014   PLT 182 03/21/2014    Lab Results  Component Value Date   CREATININE 0.80 03/21/2014   BUN 18 03/21/2014   NA 141 03/21/2014   K  4.4 03/21/2014   CL 104 03/21/2014   CO2 23 03/21/2014    Lab Results  Component Value Date   ALT 19 06/13/2013   AST 19 06/13/2013   ALKPHOS 64 06/13/2013   BILITOT 0.6 06/13/2013     Physical Exam: BP 130/86   Pulse 73   Temp 97.6 F (36.4 C)   Ht 5' 8"  (1.727 m)   Wt 190 lb (86.2 kg)   BMI 28.89 kg/m  Constitutional: Pleasant,well-developed, male in no acute distress. HEENT: Normocephalic and atraumatic. Conjunctivae are normal. No scleral icterus. Neck supple.  Cardiovascular: Normal rate, regular rhythm.  Pulmonary/chest: Effort normal and breath  sounds normal. No wheezing, rales or rhonchi. Abdominal: Soft, nondistended, nontender. There are no masses palpable.  Extremities: no edema Lymphadenopathy: No cervical adenopathy noted. Neurological: Alert and oriented to person place and time. Skin: Skin is warm and dry. No rashes noted. Psychiatric: Normal mood and affect. Behavior is normal.   ASSESSMENT AND PLAN: 69 year old male here for new patient assessment of the following:  Diverticulitis - CT findings concerning for descending colon diverticulitis, his symptoms also fit with this and have resolved with antibiotics.  This is the most likely diagnosis however his last colonoscopy was almost 3 years ago.  Based off of current recommendations given its been more than a year since his last colonoscopy, recommend a colonoscopy 6 to 8 weeks after this episode to ensure no interval development since his last exam.  I discussed risk benefits of colonoscopy and anesthesia with him and he wished to proceed.  Further recommendations pending results.  If he has any recurrence of symptoms in the interim I asked him to contact me.  He agreed  Constipation - recommend a bowel regimen daily given ongoing chronic symptoms of this and hemorrhoids.  Recommend MiraLAX daily and titrate up or down as needed.  He was agreeable to this after discussion of options.  Internal hemorrhoids - he may be a good candidate for hemorrhoid banding, he wants to avoid surgery if possible.  He wishes to await his colonoscopy results and then will discuss management of his hemorrhoids.  Treating constipation will certainly help as above.  If he is a candidate based off colonoscopy, will consider banding.  He agreed  Avera Cellar, MD [facility]  CC: Seth Bake Danella Deis, MD

## 2019-08-12 ENCOUNTER — Ambulatory Visit: Payer: Medicare HMO | Attending: Internal Medicine

## 2019-08-12 DIAGNOSIS — Z23 Encounter for immunization: Secondary | ICD-10-CM

## 2019-08-12 NOTE — Progress Notes (Signed)
   [id number]  Name:  Steven Fisher.    MRN: [id number] DOB: [birth year]  08/12/2019  Mr. Steven Fisher was observed post Covid-19 immunization for 30 minutes based on pre-vaccination screening without incident. He was provided with Vaccine Information Sheet and instruction to access the V-Safe system.   Mr. Steven Fisher was instructed to call 911 with any severe reactions post vaccine: Marland Kitchen Difficulty breathing  . Swelling of face and throat  . A fast heartbeat  . A bad rash all over body  . Dizziness and weakness   Immunizations Administered    Name Date Dose VIS Date Route   Pfizer COVID-19 Vaccine 08/12/2019  8:12 AM 0.3 mL 05/08/2019 Intramuscular   Manufacturer: [place]   Lot: [id number]   [place]: [id number]

## 2019-08-14 ENCOUNTER — Telehealth: Payer: Self-pay | Admitting: Gastroenterology

## 2019-08-14 NOTE — Telephone Encounter (Signed)
Contacted pharmacy to ask that they run prescription with coupon code. Rx is still $92 dollars. I have contacted patient to advise that we have placed a Suprep sample at the front desk for him to pick up. Patient verbalizes understanding of this.

## 2019-08-14 NOTE — Telephone Encounter (Signed)
Pt states that copay for suprep is $113. He wants to know if we have a coupon. Pls call him.

## 2019-09-08 ENCOUNTER — Other Ambulatory Visit: Payer: Self-pay

## 2019-09-08 ENCOUNTER — Encounter: Payer: Self-pay | Admitting: Gastroenterology

## 2019-09-08 ENCOUNTER — Ambulatory Visit (AMBULATORY_SURGERY_CENTER): Payer: Medicare HMO | Admitting: Gastroenterology

## 2019-09-08 VITALS — BP 99/62 | HR 51 | Temp 95.9°F | Resp 20 | Ht 68.0 in | Wt 190.0 lb

## 2019-09-08 DIAGNOSIS — D123 Benign neoplasm of transverse colon: Secondary | ICD-10-CM

## 2019-09-08 DIAGNOSIS — K5730 Diverticulosis of large intestine without perforation or abscess without bleeding: Secondary | ICD-10-CM

## 2019-09-08 DIAGNOSIS — K6289 Other specified diseases of anus and rectum: Secondary | ICD-10-CM | POA: Diagnosis not present

## 2019-09-08 DIAGNOSIS — K5732 Diverticulitis of large intestine without perforation or abscess without bleeding: Secondary | ICD-10-CM

## 2019-09-08 DIAGNOSIS — D125 Benign neoplasm of sigmoid colon: Secondary | ICD-10-CM

## 2019-09-08 DIAGNOSIS — D120 Benign neoplasm of cecum: Secondary | ICD-10-CM

## 2019-09-08 DIAGNOSIS — K644 Residual hemorrhoidal skin tags: Secondary | ICD-10-CM | POA: Diagnosis not present

## 2019-09-08 DIAGNOSIS — K648 Other hemorrhoids: Secondary | ICD-10-CM | POA: Diagnosis not present

## 2019-09-08 MED ORDER — SODIUM CHLORIDE 0.9 % IV SOLN: 500.0000 mL | Freq: Once | INTRAVENOUS | Status: DC

## 2019-09-08 NOTE — Progress Notes (Signed)
pt tolerated well. VSS. awake and to recovery. Report given to RN.  

## 2019-09-08 NOTE — Progress Notes (Signed)
Called to room to assist during endoscopic procedure.  Patient ID and intended procedure confirmed with present staff. Received instructions for my participation in the procedure from the performing physician.  

## 2019-09-08 NOTE — Progress Notes (Signed)
[place]

## 2019-09-08 NOTE — Op Note (Signed)
[place] Patient Name: Steven Fisher Procedure Date: 09/08/2019 3:09 PM MRN: [id number] Endoscopist: Remo Lipps P. Havery Moros , MD Age: 69 Referring MD:  Date of Birth: Dec 30, 1950 Gender: Male Account #: [id number] Procedure:                Colonoscopy Indications:              Follow-up of diverticulitis of descending colon Medicines:                Monitored Anesthesia Care Procedure:                Pre-Anesthesia Assessment:                           - Prior to the procedure, a History and Physical                            was performed, and patient medications and                            allergies were reviewed. The patient's tolerance of                            previous anesthesia was also reviewed. The risks                            and benefits of the procedure and the sedation                            options and risks were discussed with the patient.                            All questions were answered, and informed consent                            was obtained. Prior Anticoagulants: The patient has                            taken no previous anticoagulant or antiplatelet                            agents. ASA Grade Assessment: II - A patient with                            mild systemic disease. After reviewing the risks                            and benefits, the patient was deemed in                            satisfactory condition to undergo the procedure.                           After obtaining informed consent, the colonoscope  was passed under direct vision. Throughout the                            procedure, the patient's blood pressure, pulse, and                            oxygen saturations were monitored continuously. The                            Colonoscope was introduced through the anus and                            advanced to the the cecum, identified by                            appendiceal orifice  and ileocecal valve. The                            colonoscopy was performed without difficulty. The                            patient tolerated the procedure well. The quality                            of the bowel preparation was adequate. The                            ileocecal valve, appendiceal orifice, and rectum                            were photographed. Scope In: 3:16:41 PM Scope Out: 3:40:54 PM Scope Withdrawal Time: 0 hours 21 minutes 53 seconds  Total Procedure Duration: 0 hours 24 minutes 13 seconds  Findings:                 Skin tags were found on perianal exam.                           A 3 mm polyp was found in the cecum. The polyp was                            sessile. The polyp was removed with a cold snare.                            Resection and retrieval were complete.                           A 5 mm polyp was found in the transverse colon. The                            polyp was sessile. The polyp was removed with a                            cold snare. Resection  and retrieval were complete.                           A 5 mm polyp was found in the sigmoid colon. The                            polyp was sessile. The polyp was removed with a                            cold snare. Resection and retrieval were complete.                           Multiple medium-mouthed diverticula were found in                            the entire colon. Highest burden in the left colon.                           Two hypertrophied anal papillae were noted.                            Biopsies were taken with a cold forceps for                            histology to rule out AIN.                           Internal hemorrhoids were found during                            retroflexion, moderate in size.                           The exam was otherwise without abnormality. Complications:            No immediate complications. Estimated blood loss:                             Minimal. Estimated Blood Loss:     Estimated blood loss was minimal. Estimated blood                            loss was minimal. Impression:               - Perianal skin tags found on perianal exam.                           - One 3 mm polyp in the cecum, removed with a cold                            snare. Resected and retrieved.                           - One 5 mm polyp in the transverse colon, removed  with a cold snare. Resected and retrieved.                           - One 5 mm polyp in the sigmoid colon, removed with                            a cold snare. Resected and retrieved.                           - Diverticulosis in the entire examined colon.                            Correlates to prior diagnosis of diverticulitis.                           - Anal papilla(e) were hypertrophied. Biopsied.                           - Internal hemorrhoids.                           - The examination was otherwise normal. Recommendation:           - Patient has a contact number available for                            emergencies. The signs and symptoms of potential                            delayed complications were discussed with the                            patient. Return to normal activities tomorrow.                            Written discharge instructions were provided to the                            patient.                           - Resume previous diet.                           - Continue present medications.                           - Await pathology results.                           - Patient is a candidate for hemorrhoid banding,                            can proceed with scheduling this if he is  interested in pursuing treatment Carlota Raspberry. Yuji Walth, MD 09/08/2019 3:50:11 PM This report has been signed electronically.

## 2019-09-08 NOTE — Patient Instructions (Signed)
YOU HAD AN ENDOSCOPIC PROCEDURE TODAY AT THE [facility]:   Refer to the procedure report that was given to you for any specific questions about what was found during the examination.  If the procedure report does not answer your questions, please call your gastroenterologist to clarify.  If you requested that your care partner not be given the details of your procedure findings, then the procedure report has been included in a sealed envelope for you to review at your convenience later.  YOU SHOULD EXPECT: Some feelings of bloating in the abdomen. Passage of more gas than usual.  Walking can help get rid of the air that was put into your GI tract during the procedure and reduce the bloating. If you had a lower endoscopy (such as a colonoscopy or flexible sigmoidoscopy) you may notice spotting of blood in your stool or on the toilet paper. If you underwent a bowel prep for your procedure, you may not have a normal bowel movement for a few days.  Please Note:  You might notice some irritation and congestion in your nose or some drainage.  This is from the oxygen used during your procedure.  There is no need for concern and it should clear up in a day or so.  SYMPTOMS TO REPORT IMMEDIATELY:   Following lower endoscopy (colonoscopy or flexible sigmoidoscopy):  Excessive amounts of blood in the stool  Significant tenderness or worsening of abdominal pains  Swelling of the abdomen that is new, acute  Fever of 100F or higher  For urgent or emergent issues, a gastroenterologist can be reached at any hour by calling [phone]. Do not use MyChart messaging for urgent concerns.    DIET:  We do recommend a small meal at first, but then you may proceed to your regular diet.  Drink plenty of fluids but you should avoid alcoholic beverages for 24 hours.  ACTIVITY:  You should plan to take it easy for the rest of today and you should NOT DRIVE or use heavy machinery until tomorrow (because  of the sedation medicines used during the test).    FOLLOW UP: Our staff will call the number listed on your records 48-72 hours following your procedure to check on you and address any questions or concerns that you may have regarding the information given to you following your procedure. If we do not reach you, we will leave a message.  We will attempt to reach you two times.  During this call, we will ask if you have developed any symptoms of COVID 19. If you develop any symptoms (ie: fever, flu-like symptoms, shortness of breath, cough etc.) before then, please call [phone].  If you test positive for Covid 19 in the 2 weeks post procedure, please call and report this information to us.    If any biopsies were taken you will be contacted by phone or by letter within the next 1-3 weeks.  Please call us at [phone] if you have not heard about the biopsies in 3 weeks.    SIGNATURES/CONFIDENTIALITY: You and/or your care partner have signed paperwork which will be entered into your electronic medical record.  These signatures attest to the fact that that the information above on your After Visit Summary has been reviewed and is understood.  Full responsibility of the confidentiality of this discharge information lies with you and/or your care-partner. 

## 2019-09-09 ENCOUNTER — Telehealth: Payer: Self-pay

## 2019-09-09 NOTE — Telephone Encounter (Signed)
-----   Message from Yetta Flock, MD sent at 09/08/2019  4:06 PM EDT ----- Regarding: hemorrhoid banding Hey Jan. Can you touch base with this patient sometime this week and schedule routine hemorrhoid banding appointment? Thanks

## 2019-09-09 NOTE — Telephone Encounter (Signed)
LM for pt to call back and schedule an appt with Dr. Loni Muse for 1st banding appt. Next available is in late May

## 2019-09-10 ENCOUNTER — Telehealth: Payer: Self-pay

## 2019-09-10 NOTE — Telephone Encounter (Signed)
Called and LM for pt to call back to schedule

## 2019-09-10 NOTE — Telephone Encounter (Signed)
  Follow up Call-  Call back number 09/08/2019  Post procedure Call Back phone  # [phone]  Permission to leave phone message Yes  Some recent data might be hidden     Patient questions:  Do you have a fever, pain , or abdominal swelling? No. Pain Score  0 *  Have you tolerated food without any problems? Yes.    Have you been able to return to your normal activities? Yes.    Do you have any questions about your discharge instructions: Diet   No. Medications  No. Follow up visit  No.  Do you have questions or concerns about your Care? No.  Actions: * If pain score is 4 or above: No action needed, pain <4.  1. Have you developed a fever since your procedure? no  2.   Have you had an respiratory symptoms (SOB or cough) since your procedure? no  3.   Have you tested positive for COVID 19 since your procedure no  4.   Have you had any family members/close contacts diagnosed with the COVID 19 since your procedure?  no   If yes to any of these questions please route to Joylene John, RN and Erenest Rasher, RN

## 2019-09-11 NOTE — Telephone Encounter (Signed)
Follow up #1 banding made for 10-16-2019

## 2019-10-16 ENCOUNTER — Encounter: Payer: Self-pay | Admitting: Gastroenterology

## 2019-10-16 ENCOUNTER — Ambulatory Visit: Payer: Medicare HMO | Admitting: Gastroenterology

## 2019-10-16 VITALS — BP 106/74 | HR 85 | Ht 68.0 in | Wt 181.0 lb

## 2019-10-16 DIAGNOSIS — K642 Third degree hemorrhoids: Secondary | ICD-10-CM | POA: Diagnosis not present

## 2019-10-16 MED ORDER — AMBULATORY NON FORMULARY MEDICATION: 1 refills | Status: DC

## 2019-10-16 NOTE — Patient Instructions (Addendum)
If you are age 68 or older, your body mass index should be between 23-30. Your Body mass index is 27.52 kg/m. If this is out of the aforementioned range listed, please consider follow up with your Primary Care Provider.  If you are age 66 or younger, your body mass index should be between 19-25. Your Body mass index is 27.52 kg/m. If this is out of the aformentioned range listed, please consider follow up with your Primary Care Provider.   We have sent a prescription for nitroglycerin 0.125% gel to [facility]. You should apply a pea size amount to your rectum three times daily x 6-8 weeks.  [facility]'s information is below: Address: [address], [place], [place] [address]  [phone]  *Please DO NOT go directly from our office to pick up this medication! Give the pharmacy 1 day to process the prescription as this is compounded and takes time to make.  We have you scheduled for a possible hemorrhoid banding #1 -12/03/19 @ 4:00pm. Keep that appointment.   Thank you for choosing me and [facility].  Dr.Armbruster

## 2019-10-16 NOTE — Progress Notes (Signed)
HPI :  69 year old male here for follow-up visit for hemorrhoid therapy.  He has had longstanding hemorrhoids that cause irritation, bleeding intermittently and prolapse, grade 3.  He is requesting treatment for his hemorrhoids.  He has soft stools for the most part through diet, uses MiraLAX as needed.  We discussed risk benefits of banding and he want to proceed.   Colonoscopy 09/08/19 - Skin tags were found on perianal exam. - A 3 mm polyp was found in the cecum. The polyp was sessile. The polyp was removed with a cold snare. Resection and retrieval were complete. - A 5 mm polyp was found in the transverse colon. The polyp was sessile. The polyp was removed with a cold snare. Resection and retrieval were complete. - A 5 mm polyp was found in the sigmoid colon. The polyp was sessile. The polyp was removed with a cold snare. Resection and retrieval were complete. - Multiple medium-mouthed diverticula were found in the entire colon. Highest burden in the left colon. - Two hypertrophied anal papillae were noted. Biopsies were taken with a cold forceps for histology to rule out AIN. - Internal hemorrhoids were found during retroflexion, moderate in size. - The exam was otherwise without abnormality.    Past Medical History:  Diagnosis Date  . Diverticulitis   . Hypercholesteremia   . Hypertension   . Renal stones      Past Surgical History:  Procedure Laterality Date  . APPENDECTOMY    . orthopedic surgeries     multiple   Family History  Problem Relation Age of Onset  . Heart attack Maternal Grandfather   . Heart disease Paternal Grandfather    Social History   Tobacco Use  . Smoking status: Current Some Day Smoker    Packs/day: 1.00  . Smokeless tobacco: Never Used  Substance Use Topics  . Alcohol use: Yes  . Drug use: Yes    Types: Marijuana    Comment: last used 09/07/2019   Current Outpatient Medications  Medication Sig Dispense Refill  .  hydrochlorothiazide (HYDRODIURIL) 25 MG tablet Take by mouth.    Marland Kitchen LORazepam (ATIVAN) 1 MG tablet Take by mouth.    . losartan (COZAAR) 100 MG tablet Take 100 mg by mouth daily.    . polyethylene glycol (MIRALAX) 17 g packet Take 17 g by mouth daily. (Patient taking differently: Take 17 g by mouth as needed. ) 14 each 0  . AMBULATORY NON FORMULARY MEDICATION Medication Name: Nitroglycerine ointment 0.125 %  Apply a pea sized amount internally four times daily. Dispense 30 GM zero refill 30 g 1   No current facility-administered medications for this visit.   Allergies  Allergen Reactions  . Iohexol Anaphylaxis    Had contrast x 20 years ago, throat closed up  . Penicillins Anaphylaxis     Review of Systems: All systems reviewed and negative except where noted in HPI.    No results found.  Physical Exam: BP 106/74   Pulse 85   Ht 5\' 8"  (1.727 m)   Wt 181 lb (82.1 kg)   BMI 27.52 kg/m  DRE - skin tags, significant discomfort with DRE - no obvious fissure, no mass lesions, hemorrhoids noted   ASSESSMENT AND PLAN: 69 year old male here for reassessment the following:  Grade 3 hemorrhoids - discussed risks and benefits of hemorrhoid banding and I think he is a good candidate for it.  However on DRE as we prepared to do the banding he  has significant tenderness in his anal canal.  He has skin tag so difficult to assess for fissure but I suspect he probably has a fissure there or sensitivity/spasm.  I do not think he is a good candidate for banding today given the sensitivity he is experiencing, banding will likely make his discomfort worse.  Recommend topical nitroglycerin 0.125% 3 times daily for few weeks to reduce sensitivity and heal fissure if present, will come back later next month for another attempt at banding if he wishes to proceed.  He agreed, all questions answered  Westby Cellar, MD [facility]

## 2019-11-11 ENCOUNTER — Encounter: Payer: Medicare HMO | Admitting: Gastroenterology

## 2019-12-03 ENCOUNTER — Encounter: Payer: Medicare HMO | Admitting: Gastroenterology

## 2019-12-22 ENCOUNTER — Encounter: Payer: Medicare HMO | Admitting: Gastroenterology

## 2020-02-03 ENCOUNTER — Encounter: Payer: Medicare HMO | Admitting: Gastroenterology

## 2023-11-10 ENCOUNTER — Emergency Department (HOSPITAL_COMMUNITY)

## 2023-11-10 ENCOUNTER — Encounter (HOSPITAL_COMMUNITY)

## 2023-11-10 ENCOUNTER — Other Ambulatory Visit: Payer: Self-pay

## 2023-11-10 ENCOUNTER — Inpatient Hospital Stay (HOSPITAL_COMMUNITY)
Admission: EM | Admit: 2023-11-10 | Discharge: 2023-11-12 | DRG: 321 | Disposition: A | Discharge status: Home / Self Care | Attending: Cardiovascular Disease | Admitting: Cardiovascular Disease

## 2023-11-10 ENCOUNTER — Encounter (HOSPITAL_COMMUNITY): Payer: Self-pay

## 2023-11-10 DIAGNOSIS — I4891 Unspecified atrial fibrillation: Secondary | ICD-10-CM

## 2023-11-10 DIAGNOSIS — E7800 Pure hypercholesterolemia, unspecified: Secondary | ICD-10-CM | POA: Diagnosis not present

## 2023-11-10 DIAGNOSIS — F1010 Alcohol abuse, uncomplicated: Secondary | ICD-10-CM | POA: Diagnosis present

## 2023-11-10 DIAGNOSIS — Z79899 Other long term (current) drug therapy: Secondary | ICD-10-CM | POA: Diagnosis not present

## 2023-11-10 DIAGNOSIS — R9431 Abnormal electrocardiogram [ECG] [EKG]: Secondary | ICD-10-CM | POA: Diagnosis present

## 2023-11-10 DIAGNOSIS — I214 Non-ST elevation (NSTEMI) myocardial infarction: Secondary | ICD-10-CM | POA: Diagnosis not present

## 2023-11-10 DIAGNOSIS — R079 Chest pain, unspecified: Secondary | ICD-10-CM | POA: Diagnosis not present

## 2023-11-10 DIAGNOSIS — I2542 Coronary artery dissection: Secondary | ICD-10-CM | POA: Diagnosis not present

## 2023-11-10 DIAGNOSIS — I9788 Other intraoperative complications of the circulatory system, not elsewhere classified: Secondary | ICD-10-CM | POA: Diagnosis not present

## 2023-11-10 DIAGNOSIS — Y840 Cardiac catheterization as the cause of abnormal reaction of the patient, or of later complication, without mention of misadventure at the time of the procedure: Secondary | ICD-10-CM | POA: Diagnosis not present

## 2023-11-10 DIAGNOSIS — I10 Essential (primary) hypertension: Secondary | ICD-10-CM

## 2023-11-10 DIAGNOSIS — R7989 Other specified abnormal findings of blood chemistry: Secondary | ICD-10-CM | POA: Diagnosis not present

## 2023-11-10 DIAGNOSIS — I249 Acute ischemic heart disease, unspecified: Secondary | ICD-10-CM | POA: Diagnosis present

## 2023-11-10 DIAGNOSIS — F17210 Nicotine dependence, cigarettes, uncomplicated: Secondary | ICD-10-CM | POA: Diagnosis not present

## 2023-11-10 DIAGNOSIS — I2510 Atherosclerotic heart disease of native coronary artery without angina pectoris: Secondary | ICD-10-CM | POA: Diagnosis not present

## 2023-11-10 DIAGNOSIS — E785 Hyperlipidemia, unspecified: Secondary | ICD-10-CM | POA: Diagnosis present

## 2023-11-10 DIAGNOSIS — R7303 Prediabetes: Secondary | ICD-10-CM | POA: Diagnosis not present

## 2023-11-10 DIAGNOSIS — Z8249 Family history of ischemic heart disease and other diseases of the circulatory system: Secondary | ICD-10-CM

## 2023-11-10 DIAGNOSIS — Z720 Tobacco use: Secondary | ICD-10-CM | POA: Diagnosis present

## 2023-11-10 DIAGNOSIS — Z955 Presence of coronary angioplasty implant and graft: Secondary | ICD-10-CM

## 2023-11-10 LAB — COMPREHENSIVE METABOLIC PANEL WITH GFR
ALT: 17 U/L (ref 0–44)
AST: 19 U/L (ref 15–41)
Albumin: 3.8 g/dL (ref 3.5–5.0)
Alkaline Phosphatase: 63 U/L (ref 38–126)
Anion gap: 9 (ref 5–15)
BUN: 19 mg/dL (ref 8–23)
CO2: 22 mmol/L (ref 22–32)
Calcium: 9.4 mg/dL (ref 8.9–10.3)
Chloride: 107 mmol/L (ref 98–111)
Creatinine, Ser: 1.04 mg/dL (ref 0.61–1.24)
GFR, Estimated: >60 mL/min (ref >60)
Glucose, Bld: 111 mg/dL — ABNORMAL HIGH (ref 70–99)
Potassium: 4.1 mmol/L (ref 3.5–5.1)
Sodium: 138 mmol/L (ref 135–145)
Total Bilirubin: 0.7 mg/dL (ref 0.0–1.2)
Total Protein: 6.7 g/dL (ref 6.5–8.1)

## 2023-11-10 LAB — CBC WITH DIFFERENTIAL/PLATELET
Abs Immature Granulocytes: 0.03 10*3/uL (ref 0.00–0.07)
Basophils Absolute: 0.1 10*3/uL (ref 0.0–0.1)
Basophils Relative: 1 %
Eosinophils Absolute: 0.2 10*3/uL (ref 0.0–0.5)
Eosinophils Relative: 2 %
HCT: 50.5 % (ref 39.0–52.0)
Hemoglobin: 16.7 g/dL (ref 13.0–17.0)
Immature Granulocytes: 0 %
Lymphocytes Relative: 38 %
Lymphs Abs: 2.7 10*3/uL (ref 0.7–4.0)
MCH: 30.3 pg (ref 26.0–34.0)
MCHC: 33.1 g/dL (ref 30.0–36.0)
MCV: 91.7 fL (ref 80.0–100.0)
Monocytes Absolute: 0.6 10*3/uL (ref 0.1–1.0)
Monocytes Relative: 9 %
Neutro Abs: 3.7 10*3/uL (ref 1.7–7.7)
Neutrophils Relative %: 50 %
Platelets: 198 10*3/uL (ref 150–400)
RBC: 5.51 MIL/uL (ref 4.22–5.81)
RDW: 14.3 % (ref 11.5–15.5)
WBC: 7.3 10*3/uL (ref 4.0–10.5)
nRBC: 0 % (ref 0.0–0.2)

## 2023-11-10 LAB — TROPONIN I (HIGH SENSITIVITY)
Troponin I (High Sensitivity): 250 ng/L (ref <18)
Troponin I (High Sensitivity): 242 ng/L (ref <18)

## 2023-11-10 LAB — MAGNESIUM: Magnesium: 1.9 mg/dL (ref 1.7–2.4)

## 2023-11-10 LAB — BRAIN NATRIURETIC PEPTIDE: B Natriuretic Peptide: 396.3 pg/mL — ABNORMAL HIGH (ref 0.0–100.0)

## 2023-11-10 LAB — D-DIMER, QUANTITATIVE: D-Dimer, Quant: <0.27 ug{FEU}/mL (ref 0.00–0.50)

## 2023-11-10 LAB — PHOSPHORUS: Phosphorus: 4.2 mg/dL (ref 2.5–4.6)

## 2023-11-10 MED ORDER — ONDANSETRON HCL 4 MG PO TABS: 4.0000 mg | ORAL_TABLET | Freq: Four times a day (QID) | ORAL | Status: DC | PRN

## 2023-11-10 MED ORDER — ACETAMINOPHEN 325 MG PO TABS: 650.0000 mg | ORAL_TABLET | Freq: Four times a day (QID) | ORAL | Status: DC | PRN

## 2023-11-10 MED ORDER — ENOXAPARIN SODIUM 40 MG/0.4ML IJ SOSY
40.0000 mg | PREFILLED_SYRINGE | INTRAMUSCULAR | Status: DC
  Administered 2023-11-10: 40 mg via SUBCUTANEOUS
  Filled 2023-11-10: qty 0.4

## 2023-11-10 MED ORDER — ASPIRIN 81 MG PO TBEC
81.0000 mg | DELAYED_RELEASE_TABLET | Freq: Every day | ORAL | Status: DC
  Filled 2023-11-10: qty 1

## 2023-11-10 MED ORDER — HEPARIN (PORCINE) 25000 UT/250ML-% IV SOLN
1100.0000 [IU]/h | INTRAVENOUS | Status: DC
  Administered 2023-11-10: 1100 [IU]/h via INTRAVENOUS
  Filled 2023-11-10: qty 250

## 2023-11-10 MED ORDER — ONDANSETRON HCL 4 MG/2ML IJ SOLN: 4.0000 mg | Freq: Four times a day (QID) | INTRAMUSCULAR | Status: DC | PRN

## 2023-11-10 MED ORDER — CARVEDILOL 6.25 MG PO TABS
6.2500 mg | ORAL_TABLET | Freq: Two times a day (BID) | ORAL | Status: DC
  Administered 2023-11-11 – 2023-11-12 (×2): 6.25 mg via ORAL
  Filled 2023-11-10 (×3): qty 1

## 2023-11-10 MED ORDER — ATORVASTATIN CALCIUM 40 MG PO TABS
40.0000 mg | ORAL_TABLET | Freq: Every day | ORAL | Status: DC
  Filled 2023-11-10: qty 1

## 2023-11-10 MED ORDER — ASPIRIN 325 MG PO TABS
325.0000 mg | ORAL_TABLET | Freq: Once | ORAL | Status: AC
  Administered 2023-11-10: 325 mg via ORAL
  Filled 2023-11-10: qty 1

## 2023-11-10 MED ORDER — NITROGLYCERIN 0.4 MG SL SUBL
0.4000 mg | SUBLINGUAL_TABLET | SUBLINGUAL | Status: DC | PRN
  Administered 2023-11-10 (×2): 0.4 mg via SUBLINGUAL
  Filled 2023-11-10: qty 1

## 2023-11-10 MED ORDER — HEPARIN BOLUS VIA INFUSION
2500.0000 [IU] | Freq: Once | INTRAVENOUS | Status: AC
  Administered 2023-11-10: 2500 [IU] via INTRAVENOUS
  Filled 2023-11-10: qty 2500

## 2023-11-10 MED ORDER — ACETAMINOPHEN 650 MG RE SUPP: 650.0000 mg | Freq: Four times a day (QID) | RECTAL | Status: DC | PRN

## 2023-11-10 MED ORDER — ENOXAPARIN SODIUM 40 MG/0.4ML IJ SOSY: 40.0000 mg | PREFILLED_SYRINGE | INTRAMUSCULAR | Status: DC

## 2023-11-10 NOTE — H&P (Signed)
 History and Physical    Patient: Steven Fisher. ZOX:[id number] DOB: [birth year] DOA: 11/10/2023 DOS: the patient was seen and examined on 11/10/2023 PCP: Scarlett Current, PA-C  Patient coming from: Home  Chief Complaint:  Chief Complaint  Patient presents with   Chest Pain   HPI: [name]. is a 73 y.o. male with medical history significant of diverticulosis, diverticulitis, prediabetes, hyperlipidemia, hypertension, nephrolithiasis, polycythemia, hepatitis C, hypogonadism in male who presented to the emergency department with complaints of chest pain radiated to his neck and jaw associated with dyspnea, dizziness, nausea and emesis.  He has also had lower extremity edema since earlier this week.  No palpitations or diaphoresis.  The patient smokes about half a pack of cigarettes a day.  He drinks scotch 2-3 drinks every night.  His sister had an MI at age 23.  He denied fever, chills, rhinorrhea, sore throat, wheezing or hemoptysis.  No abdominal pain, nausea, emesis, diarrhea, constipation, melena or hematochezia.  No flank pain, dysuria, frequency or hematuria.  No polyuria, polydipsia, polyphagia or blurred vision.   Lab work: CBC and D-dimer were normal.  CMP showed a glucose of 111 mg/dL but was otherwise unremarkable.  Troponin was 250 ng/L and BNP 396.3 pg/mL.  Imaging: 2 view chest radiograph with no acute intrathoracic process.   ED course: Initial vital signs were temperature 97.7 F, pulse 68, respiration 18, BP 153/98 mmHg O2 sat 98% on room air.  The patient received aspirin  325 mg p.o. x 1.  Case discussed with cardiology who requested transfer to [facility].  Review of Systems: As mentioned in the history of present illness. All other systems reviewed and are negative.  Past Medical History:  Diagnosis Date   Diverticulitis    Hypercholesteremia    Hypertension    Renal stones    Past Surgical History:  Procedure Laterality Date   APPENDECTOMY      orthopedic surgeries     multiple   Social History:  reports that he has been smoking. He has never used smokeless tobacco. He reports current alcohol use. He reports current drug use. Drug: Marijuana.  Allergies  Allergen Reactions   Iohexol  Anaphylaxis    Had contrast x 20 years ago, throat closed up   Penicillins Anaphylaxis    Family History  Problem Relation Age of Onset   Heart attack Maternal Grandfather    Heart disease Paternal Grandfather     Prior to Admission medications   Medication Sig Start Date End Date Taking? Authorizing Provider  AMBULATORY NON FORMULARY MEDICATION Medication Name: Nitroglycerine ointment 0.125 %  Apply a pea sized amount internally four times daily. Dispense 30 GM zero refill 10/16/19   Armbruster, Lendon Queen, MD  hydrochlorothiazide (HYDRODIURIL) 25 MG tablet Take by mouth. 04/23/16 12/15/19  [provider]  LORazepam  (ATIVAN ) 1 MG tablet Take by mouth. 03/18/19   [provider]  losartan (COZAAR) 100 MG tablet Take 100 mg by mouth daily. 08/08/19   [provider]  polyethylene glycol (MIRALAX ) 17 g packet Take 17 g by mouth daily. Patient taking differently: Take 17 g by mouth as needed.  08/10/19   Ace Holder, MD    Physical Exam: Vitals:   11/10/23 1159  BP: (!) 153/98  Pulse: 68  Resp: 18  Temp: 97.7 F (36.5 C)  TempSrc: Oral  SpO2: 98%  Weight: 83.9 kg  Height: 5' 8 (1.727 m)   Physical Exam Vitals and nursing note reviewed.  Constitutional:  General: He is awake. He is not in acute distress.    Appearance: He is well-developed and overweight. He is ill-appearing.  HENT:     Head: Normocephalic.     Nose: No rhinorrhea.     Mouth/Throat:     Mouth: Mucous membranes are moist.   Eyes:     General: No scleral icterus.    Pupils: Pupils are equal, round, and reactive to light.   Neck:     Vascular: No JVD.   Cardiovascular:     Rate and Rhythm: Normal rate and regular rhythm.      Heart sounds: S1 normal and S2 normal.  Pulmonary:     Breath sounds: No wheezing, rhonchi or rales.  Abdominal:     General: Abdomen is protuberant. There is no distension.     Palpations: Abdomen is soft.     Tenderness: There is no abdominal tenderness. There is no right CVA tenderness or left CVA tenderness.     Hernia: A hernia is present. Hernia is present in the umbilical area.   Musculoskeletal:     Cervical back: Neck supple.     Right lower leg: 1+ Pitting Edema present.     Left lower leg: 1+ Pitting Edema present.   Skin:    General: Skin is warm and dry.   Neurological:     General: No focal deficit present.     Mental Status: He is alert and oriented to person, place, and time.   Psychiatric:        Mood and Affect: Mood normal.        Behavior: Behavior normal. Behavior is cooperative.     Data Reviewed:  Results are pending, will review when available.  EKG: Vent. rate 67 BPM PR interval 136 ms QRS duration 92 ms QT/QTcB 383/405 ms P-R-T axes 56 80 121 Sinus rhythm Abnormal T, consider ischemia, lateral leads  Assessment and Plan: Principal Problem:   ACS (acute coronary syndrome) (HCC) Associated with:   Elevated troponin   Elevated brain natriuretic peptide (BNP) level   Abnormal EKG Observation/PCU. Supplemental oxygen as needed. Hold heparin infusion for now. Continue daily aspirin . Trend troponin level. Serial EKG. Obtain echocardiogram. Cardiology will see at [facility].  Active Problems:   Essential hypertension Currently not on medication. Monitor blood pressure.    Hyperlipidemia LDL goal <130 Check fasting lipids.    Prediabetes Check hemoglobin A1c. Check fasting glucose in AM.    Alcohol abuse No symptoms of the moment. CIWA protocol with lorazepam . Magnesium sulfate supplementation. Folate, MVI and thiamine. Consult TOC team.    Tobacco abuse Tobacco cessation advised. Nicotine replacement therapy ordered.      Advance Care Planning:   Code Status: Full Code   Consults: Cardiology will obtain A1c arrival.  Subsequent hospital.  Family Communication:   Severity of Illness: The appropriate patient status for this patient is OBSERVATION. Observation status is judged to be reasonable and necessary in order to provide the required intensity of service to ensure the patient's safety. The patient's presenting symptoms, physical exam findings, and initial radiographic and laboratory data in the context of their medical condition is felt to place them at decreased risk for further clinical deterioration. Furthermore, it is anticipated that the patient will be medically stable for discharge from the hospital within 2 midnights of admission.   Author: Danice Dural, MD 11/10/2023 2:17 PM  For on call review [email or web address].   This document was prepared using Conservation officer, historic buildings  and may contain some unintended transcription errors.

## 2023-11-10 NOTE — ED Triage Notes (Addendum)
 Patient POV from home. Patient c/o chest pain and SHOB for a week. Patient states episodes last about an hour and causes SHOB. Pain radiates to neck and jaw causing jaw to become tight. Pt c/o nausea w/o vomiting. Patient has a history of A Fib and does not take medication for it. Patient denies LOC but states he does become dizzy. Patient states he takes a baby aspirin  when episodes occur.

## 2023-11-10 NOTE — ED Notes (Signed)
 Report called to [facility], RN @ [address].

## 2023-11-10 NOTE — ED Provider Notes (Signed)
 [place] EMERGENCY DEPARTMENT AT [facility] Provider Note   CSN: [id number] Arrival date & time: 11/10/23  1154     Patient presents with: Chest Pain   Steven Fisher. is a 73 y.o. male with a history of hypertension who presents the ED today for chest pain.  Patient endorses intermittent episodes of chest pain and shortness of breath for the past 2 weeks.  States that pain is in the center of his chest and radiates up to his neck and jaw.  He is taking baby aspirin  and ibuprofen for his pain and will improvement.  States that episodes usually last about an hour and are associated with nausea and dizziness.  Pain is not worse with movement or deep inspiration.  Also has bilateral lower extremity edema with more swelling in the left leg than right. Reports history of atrial fibrillation but does not seem to have a formal diagnosis.  He is not on any blood thinners.  Has never seen a cardiologist before.  Denies any chest pain or shortness of breath at time of evaluation.    Prior to Admission medications   Medication Sig Start Date End Date Taking? Authorizing Provider  calcium carbonate (TUMS - DOSED IN MG ELEMENTAL CALCIUM) 500 MG chewable tablet Chew 2-4 tablets by mouth daily.   Yes [provider]  ibuprofen (ADVIL) 800 MG tablet Take 800 mg by mouth daily. 07/06/21  Yes [provider]  LORazepam  (ATIVAN ) 1 MG tablet Take by mouth. 03/18/19  Yes [provider]  Omega-3 Fatty Acids (FISH OIL OMEGA-3 PO) Take 2,800 mg by mouth daily.   Yes [provider]  AMBULATORY NON FORMULARY MEDICATION Medication Name: Nitroglycerine ointment 0.125 %  Apply a pea sized amount internally four times daily. Dispense 30 GM zero refill Patient not taking: Reported on 11/10/2023 10/16/19   Ace Holder, MD  hydrochlorothiazide (HYDRODIURIL) 25 MG tablet Take by mouth. Patient not taking: Reported on 11/10/2023 04/23/16 12/15/19  [provider]  losartan (COZAAR) 100 MG tablet Take 100 mg by mouth daily. Patient not taking: Reported on 11/10/2023 08/08/19   [provider]  polyethylene glycol (MIRALAX ) 17 g packet Take 17 g by mouth daily. Patient not taking: Reported on 11/10/2023 08/10/19   Ace Holder, MD    Allergies: Iohexol  and Penicillins    Review of Systems  Cardiovascular:  Positive for chest pain.  All other systems reviewed and are negative.   Updated Vital Signs BP (!) 147/98   Pulse 62   Temp 97.7 F (36.5 C)   Resp 15   Ht 5' 8 (1.727 m)   Wt 83.9 kg   SpO2 97%   BMI 28.13 kg/m   Physical Exam Vitals and nursing note reviewed.  Constitutional:      General: He is not in acute distress.    Appearance: Normal appearance.  HENT:     Head: Normocephalic and atraumatic.     Mouth/Throat:     Mouth: Mucous membranes are moist.   Eyes:     Conjunctiva/sclera: Conjunctivae normal.     Pupils: Pupils are equal, round, and reactive to light.    Cardiovascular:     Rate and Rhythm: Normal rate and regular rhythm.     Pulses: Normal pulses.     Heart sounds: Normal heart sounds.  Pulmonary:     Effort: Pulmonary effort is normal.     Breath sounds: Normal breath sounds.  Abdominal:  Palpations: Abdomen is soft.     Tenderness: There is no abdominal tenderness.   Musculoskeletal:        General: Normal range of motion.     Cervical back: Normal range of motion.     Right lower leg: Edema present.     Left lower leg: Edema present.     Comments: Bilateral lower extremity edema without tenderness to palpation.   Skin:    General: Skin is warm and dry.     Findings: No rash.   Neurological:     General: No focal deficit present.     Mental Status: He is alert.   Psychiatric:        Mood and Affect: Mood normal.        Behavior: Behavior normal.    (all labs ordered are listed, but only abnormal results are displayed) Labs Reviewed  COMPREHENSIVE METABOLIC PANEL  WITH GFR - Abnormal; Notable for the following components:      Result Value   Glucose, Bld 111 (*)    All other components within normal limits  BRAIN NATRIURETIC PEPTIDE - Abnormal; Notable for the following components:   B Natriuretic Peptide 396.3 (*)    All other components within normal limits  TROPONIN I (HIGH SENSITIVITY) - Abnormal; Notable for the following components:   Troponin I (High Sensitivity) 250 (*)    All other components within normal limits  TROPONIN I (HIGH SENSITIVITY) - Abnormal; Notable for the following components:   Troponin I (High Sensitivity) 242 (*)    All other components within normal limits  CBC WITH DIFFERENTIAL/PLATELET  D-DIMER, QUANTITATIVE  MAGNESIUM  PHOSPHORUS  LIPOPROTEIN A (LPA)  CBC  COMPREHENSIVE METABOLIC PANEL WITH GFR    EKG: EKG Interpretation Date/Time:  Sunday November 10 2023 12:00:41 EDT Ventricular Rate:  67 PR Interval:  136 QRS Duration:  92 QT Interval:  383 QTC Calculation: 405 R Axis:   80  Text Interpretation: Sinus rhythm Abnormal T, consider ischemia, lateral leads Confirmed by Angela Kell [phone]) on 11/10/2023 12:43:07 PM  Radiology: [facility] Query Chest 2 View Result Date: 11/10/2023 CLINICAL DATA:  Short of breath, chest pain for 1 week EXAM: CHEST - 2 VIEW COMPARISON:  06/13/2013 FINDINGS: Frontal and lateral views of the chest demonstrate an unremarkable cardiac silhouette. No acute airspace disease, effusion, or pneumothorax. No acute bony abnormalities. IMPRESSION: 1. No acute intrathoracic process. Electronically Signed   By: Bobbye Burrow M.D.   On: 11/10/2023 13:04     Procedures   Medications Ordered in the ED  aspirin  EC tablet 81 mg (has no administration in time range)  nitroGLYCERIN (NITROSTAT) SL tablet 0.4 mg (has no administration in time range)  enoxaparin (LOVENOX) injection 40 mg (has no administration in time range)  acetaminophen  (TYLENOL ) tablet 650 mg (has no administration in time range)    Or   acetaminophen  (TYLENOL ) suppository 650 mg (has no administration in time range)  ondansetron (ZOFRAN) tablet 4 mg (has no administration in time range)    Or  ondansetron (ZOFRAN) injection 4 mg (has no administration in time range)  aspirin  tablet 325 mg (325 mg Oral Given 11/10/23 1424)                                    Medical Decision Making Amount and/or Complexity of Data Reviewed Labs: ordered. Radiology: ordered.  Risk OTC drugs. Decision regarding hospitalization.   This  patient presents to the ED for concern of chest pain, this involves an extensive number of treatment options, and is a complaint that carries with it a high risk of complications and morbidity.   Differential diagnosis includes: ACS, PE, CHF, costochondritis, pleurisy, etc.   Comorbidities  See HPI above   Additional History  Additional history obtained from prior PCP records   Cardiac Monitoring / EKG  The patient was maintained on a cardiac monitor.  I personally viewed and interpreted the cardiac monitored which showed: sinus rhythm with a heart rate of 67 bpm.   Lab Tests  I ordered and personally interpreted labs.  The pertinent results include:   CMP and CBC are unremarkable Negative d-dimer Initial troponin of 250, delta troponin of 242 BNP of 396.3   Imaging Studies  I ordered imaging studies including CXR  I independently visualized and interpreted imaging which showed:  No acute cardiopulmonary disease. I agree with the radiologist interpretation   Consultations  I requested consultation with Dr. Marven Slimmer with cardiology,  and discussed lab and imaging findings as well as pertinent plan - they recommend: transfer to [facility] for further evaluation I requested consultation with Dr. Bonita Bussing with [facility],  and discussed lab and imaging findings as well as pertinent plan - they recommend: admit for further work up and evaluation   Problem List / ED Course / Critical Interventions  / Medication Management  Has been having intermittent chest pain, shortness of breath, and dizziness for the past 2 weeks. Patient has been diagnosed with hypertension in the past but is not currently on any antihypertensives.  Reports that he has a history of atrial fibrillation but does not have a formal diagnosis nor is he on blood thinners. Denies chest pain or shortness of breath at this time. Also is having bilateral lower extremity edema without tenderness. I ordered medications including: Aspirin  for chest pain/elevated troponin I have reviewed the patients home medicines and have made adjustments as needed   Social Determinants of Health  Tobacco use   Test / Admission - Considered  Discussed findings with patient and family at bedside.  They are agreeable with plan for admission.    Final diagnoses:  Elevated troponin    ED Discharge Orders     None          Sonnie Dusky, PA-C 11/10/23 1823    Burnette Carte, MD 11/15/23 [phone]

## 2023-11-10 NOTE — Progress Notes (Signed)
 Pt c/o chest pain 8/10 SL nitroglycerin given  after 5 minutes no change in pain 2nd nitroglycerin given after 5 minutes pt stated pain remains 8/10 refused 3rd nitroglycering reports drinking 2-3 liters of [place] Scotch a week

## 2023-11-10 NOTE — ED Notes (Signed)
 Care link transport en route

## 2023-11-10 NOTE — Progress Notes (Signed)
 PHARMACY - ANTICOAGULATION CONSULT NOTE  Pharmacy Consult for heparin Indication: chest pain/ACS  Allergies  Allergen Reactions   Iohexol  Anaphylaxis    Had contrast x 20 years ago, throat closed up   Penicillins Anaphylaxis    Patient Measurements: Height: 5' 8 (172.7 cm) Weight: 96 kg (211 lb 10.3 oz) IBW/kg (Calculated) : 68.4 HEPARIN DW (KG): 88.7  Vital Signs: Temp: 97.7 F (36.5 C) (06/15 1839) Temp Source: Oral (06/15 1839) BP: 139/87 (06/15 2042) Pulse Rate: 68 (06/15 1839)  Labs: Recent Labs    11/10/23 1311 11/10/23 1419  HGB 16.7  --   HCT 50.5  --   PLT 198  --   CREATININE 1.04  --   TROPONINIHS 250* 242*    Estimated Creatinine Clearance: 72.1 mL/min (by C-G formula based on SCr of 1.04 mg/dL).   Medical History: Past Medical History:  Diagnosis Date   Diverticulitis    Hypercholesteremia    Hypertension    Renal stones     Medications:  Medications Prior to Admission  Medication Sig Dispense Refill Last Dose/Taking   calcium carbonate (TUMS - DOSED IN MG ELEMENTAL CALCIUM) 500 MG chewable tablet Chew 2-4 tablets by mouth daily.   11/09/2023   ibuprofen (ADVIL) 800 MG tablet Take 800 mg by mouth daily.   11/09/2023   LORazepam  (ATIVAN ) 1 MG tablet Take by mouth.   Unknown   Omega-3 Fatty Acids (FISH OIL OMEGA-3 PO) Take 2,800 mg by mouth daily.   11/09/2023   AMBULATORY NON FORMULARY MEDICATION Medication Name: Nitroglycerine ointment 0.125 %  Apply a pea sized amount internally four times daily. Dispense 30 GM zero refill (Patient not taking: Reported on 11/10/2023) 30 g 1 Not Taking   hydrochlorothiazide (HYDRODIURIL) 25 MG tablet Take by mouth. (Patient not taking: Reported on 11/10/2023)   Not Taking   losartan (COZAAR) 100 MG tablet Take 100 mg by mouth daily. (Patient not taking: Reported on 11/10/2023)   Not Taking   polyethylene glycol (MIRALAX ) 17 g packet Take 17 g by mouth daily. (Patient not taking: Reported on 11/10/2023) 14 each 0 Not  Taking    Assessment: 73 yom who presented to the [facility] ED with chest pain and SOB for a week. Pharmacy consulted to begin IV heparin. He does have a hx self reported Afib and is not on anticoagulation at home. He had enoxaparin 40 mg SQ at 18:53. Renal function and CBC are normal. Troponin is elevated at 242.  Goal of Therapy:  Heparin level 0.3-0.7 units/ml Monitor platelets by anticoagulation protocol: Yes   Plan:  Heparin 2500 unit IV bolus then infuse at 1100 units/hr 8 hr heparin level Daily heparin level and CBC Monitor for signs/symptoms of bleeding  Possible cath on 6/16  Thank you for involving pharmacy in this patient's care.  Caroline Cinnamon, PharmD, BCPS Clinical Pharmacist Clinical phone for 11/10/2023 is [phone] 11/10/2023 9:32 PM

## 2023-11-10 NOTE — Consult Note (Signed)
 Cardiology Consultation   Patient ID: Steven Fisher. MRN: [id number]; DOB: 01/24/1951  Admit date: 11/10/2023 Date of Consult: 11/10/2023  PCP:  Scarlett Current, PA-C   [facility] Providers Cardiologist:  None        Patient Profile: Steven Fisher. is a 73 y.o. male with a significant history of self reported atrial fibrillation, cigarette use, prior hypertension now resolved per patient, hypercholesteremia, pre-diabetes, alcohol misuse, diverticulosis complicated by prior  diverticulitis, polycythemia, nephrolithiasis, and hepatitis C who is being seen 11/10/2023 for the evaluation of chest pain at the request of Dr. Lucienne Ryder.  History of Present Illness: Steven Fisher  reports around 2 weeks ago he noticed sudden chest discomfort that felt like an ache at the center of his chest, going to his jaw, neck, and left shoulder.  The pain was not in the setting of doing anything strenuous, lasted around 30 minutes, and went away with rest.  His pain was associated with dizziness, nausea, and shortness of breath.  Since then, he has noticed chest pain daily that's sporadic and lasting only 30 minutes.  He took baby aspirin  to help with the pain at home.  He recently noticed swelling in his legs as well.  Due to not feeling well, his son in-law brought him to [facility] for further evaluation. He is currently chest pain free.    When seen at [facility], he was noted to be hypertensive 153/98 mmHg but hemodynamically stable.  ECG revealed sinus rhythm with non-specific T-wave abnormality.  Initial HS-troponin was 250 ng/L and went to 242 ng/L.  Patient was given high-dose aspirin  but was not placed on heparin drip.  Patient was discussed with Dr. Marven Slimmer with cardiology and it was decided to have patient transferred to our facility with cardiology to consult.    Of note, Steven Fisher reports being told by emergency medical services that he had atrial fibrillation.  He  reports periodic episodes of a fluttering feeling in his chest often associated with dizziness and shortness of breath.  Patient states that physician provider has never given him a formal diagnosis of atrial fibrillation.     Past Medical History:  Diagnosis Date   Diverticulitis    Hypercholesteremia    Hypertension    Renal stones     Past Surgical History:  Procedure Laterality Date   APPENDECTOMY     orthopedic surgeries     multiple     Home Medications:  Prior to Admission medications   Medication Sig Start Date End Date Taking? Authorizing Provider  calcium carbonate (TUMS - DOSED IN MG ELEMENTAL CALCIUM) 500 MG chewable tablet Chew 2-4 tablets by mouth daily.   Yes [provider]  ibuprofen (ADVIL) 800 MG tablet Take 800 mg by mouth daily. 07/06/21  Yes [provider]  LORazepam  (ATIVAN ) 1 MG tablet Take by mouth. 03/18/19  Yes [provider]  Omega-3 Fatty Acids (FISH OIL OMEGA-3 PO) Take 2,800 mg by mouth daily.   Yes [provider]  AMBULATORY NON FORMULARY MEDICATION Medication Name: Nitroglycerine ointment 0.125 %  Apply a pea sized amount internally four times daily. Dispense 30 GM zero refill Patient not taking: Reported on 11/10/2023 10/16/19   Ace Holder, MD  hydrochlorothiazide (HYDRODIURIL) 25 MG tablet Take by mouth. Patient not taking: Reported on 11/10/2023 04/23/16 12/15/19  [provider]  losartan (COZAAR) 100 MG tablet Take 100 mg by mouth daily. Patient not taking: Reported on  11/10/2023 08/08/19   [provider]  polyethylene glycol (MIRALAX ) 17 g packet Take 17 g by mouth daily. Patient not taking: Reported on 11/10/2023 08/10/19   Ace Holder, MD    Scheduled Meds:  Cecily Cohen ON 11/11/2023] aspirin  EC  81 mg Oral Daily   enoxaparin (LOVENOX) injection  40 mg Subcutaneous Q24H   Continuous Infusions:  PRN Meds: acetaminophen  **OR** acetaminophen , nitroGLYCERIN, ondansetron **OR**  ondansetron (ZOFRAN) IV  Allergies:    Allergies  Allergen Reactions   Iohexol  Anaphylaxis    Had contrast x 20 years ago, throat closed up   Penicillins Anaphylaxis    Social History:   Social History   Socioeconomic History   Marital status: Married    Spouse name: Not on file   Number of children: Not on file   Years of education: Not on file   Highest education level: Not on file  Occupational History   Not on file  Tobacco Use   Smoking status: Some Days    Current packs/day: 1.00    Types: Cigarettes   Smokeless tobacco: Never  Substance and Sexual Activity   Alcohol use: Yes   Drug use: Yes    Types: Marijuana    Comment: last used 09/07/2019   Sexual activity: Not on file  Other Topics Concern   Not on file  Social History Narrative   Not on file   Social Drivers of Health   Financial Resource Strain: Not on file  Food Insecurity: Not on file  Transportation Needs: Not on file  Physical Activity: Not on file  Stress: Not on file  Social Connections: Not on file  Intimate Partner Violence: Not on file    Family History:    Family History  Problem Relation Age of Onset   Heart attack Maternal Grandfather    Heart disease Paternal Grandfather      ROS:  Please see the history of present illness.   All other ROS reviewed and negative.     Physical Exam/Data: Vitals:   11/10/23 1700 11/10/23 1800 11/10/23 1839 11/10/23 2042  BP: (!) 162/115 (!) 147/98 (!) 143/82 139/87  Pulse: 63 62 68   Resp: 15 15 18    Temp:  97.7 F (36.5 C) 97.7 F (36.5 C)   TempSrc:   Oral   SpO2: 96% 97% 96%   Weight:   96 kg   Height:   5' 8 (1.727 m)    No intake or output data in the 24 hours ending 11/10/23 2113    11/10/2023    6:39 PM 11/10/2023   11:59 AM 10/16/2019    3:33 PM  Last 3 Weights  Weight (lbs) 211 lb 10.3 oz 185 lb 181 lb  Weight (kg) 96 kg 83.915 kg 82.101 kg     Body mass index is 32.18 kg/m.  General:  Well nourished, well developed,  in no acute distress HEENT: normal Neck: no JVD Vascular: No carotid bruits; Distal pulses 2+ bilaterally Cardiac:  normal S1, S2; RRR; no murmur  Lungs:  clear to auscultation bilaterally, no wheezing, rhonchi or rales  Abd: soft, nontender, no hepatomegaly  Ext: 1+ bilateral edema Musculoskeletal:  No deformities, BUE and BLE strength normal and equal Skin: warm and dry  Neuro:  CNs 2-12 intact, no focal abnormalities noted Psych:  Normal affect   EKG:  The EKG was personally reviewed and demonstrates:  11/10/23 (12:00 hrs): sinus rhythm with non-specific T-wave abnormality. Telemetry:  Telemetry was personally reviewed and  demonstrates:  sinus rhythm  Relevant CV Studies: None  Laboratory Data: High Sensitivity Troponin:   Recent Labs  Lab 11/10/23 1311 11/10/23 1419  TROPONINIHS 250* 242*     Chemistry Recent Labs  Lab 11/10/23 1311 11/10/23 1858  NA 138  --   K 4.1  --   CL 107  --   CO2 22  --   GLUCOSE 111*  --   BUN 19  --   CREATININE 1.04  --   CALCIUM 9.4  --   MG  --  1.9  GFRNONAA >60  --   ANIONGAP 9  --     Recent Labs  Lab 11/10/23 1311  PROT 6.7  ALBUMIN 3.8  AST 19  ALT 17  ALKPHOS 63  BILITOT 0.7   Lipids No results for input(s): CHOL, TRIG, HDL, LABVLDL, LDLCALC, CHOLHDL in the last 168 hours.  Hematology Recent Labs  Lab 11/10/23 1311  WBC 7.3  RBC 5.51  HGB 16.7  HCT 50.5  MCV 91.7  MCH 30.3  MCHC 33.1  RDW 14.3  PLT 198   Thyroid No results for input(s): TSH, FREET4 in the last 168 hours.  BNP Recent Labs  Lab 11/10/23 1312  BNP 396.3*    DDimer  Recent Labs  Lab 11/10/23 1311  DDIMER <0.27    Radiology/Studies:  DG Chest 2 View Result Date: 11/10/2023 CLINICAL DATA:  Short of breath, chest pain for 1 week EXAM: CHEST - 2 VIEW COMPARISON:  06/13/2013 FINDINGS: Frontal and lateral views of the chest demonstrate an unremarkable cardiac silhouette. No acute airspace disease, effusion, or  pneumothorax. No acute bony abnormalities. IMPRESSION: 1. No acute intrathoracic process. Electronically Signed   By: Bobbye Burrow M.D.   On: 11/10/2023 13:04     Assessment and Plan: Chest pain: Patient with intermittent chest pain concerning for angina.  In the setting of an elevated HS-troponin that's slowly trending downward, making the possibility of a late presenting myocardial infarction that has transgressed to unstable angina.  Low suspicion for myopericarditis or pulmonary embolism at this time.  He is hemodynamically stable and chest pain free.  Patient was given high-dose aspirin  but not started on heparin drip.     --We will initiate a heparin drip. --Continue aspirin . --Start atorvastatin 40 mg daily.   --Start carvedilol given probable coronary artery disease and noted hypertension.   --Make NPO after midnight for possible coronary angiography in the morning.   --Get an echocardiogram.   Hypertension: Patient with primary hypertension without overt end-organ damage.  Patient took losartan and hydrochlorothiazide prior but reports being taken off due to naturally controlled blood pressure at home.  Blood pressure at this time is uncontrolled. --Reasonable to consider carvedilol at 12.5 mg twice daily if blood pressure remains uncontrolled.  Hypercholesteremia: Noted mildly elevated cholesterol levels at outside facility.  Only taking over the counter fish oil. --Given concern for possible coronary artery disease, reasonable to initiate atorvastatin 40 mg daily.  Atrial fibrillation (self-reported): Per patient, told by prior emergency medical services called to his home due to palpitations years prior.  Based on chart documentation and per patient, this was never diagnosed by a physician.  He reports periodic palpitations. --Patient should have a 30 day event monitor if nothing is noted on telemetry while hospitalized.    Cigarette use: --Management per primary team with  initiation of nicotine replacement therapy.    Risk Assessment/Risk Scores:    TIMI Risk Score for Unstable Angina or  Non-ST Elevation MI:   The patient's TIMI risk score is 5, which indicates a 26% risk of all cause mortality, new or recurrent myocardial infarction or need for urgent revascularization in the next 14 days.         For questions or updates, please contact [facility] Please consult [email or web address] for contact info under    Signed, Antonieta Battle, MD  11/10/2023 9:13 PM

## 2023-11-11 ENCOUNTER — Encounter (HOSPITAL_COMMUNITY): Payer: Self-pay | Admitting: Cardiovascular Disease

## 2023-11-11 ENCOUNTER — Observation Stay (HOSPITAL_COMMUNITY)

## 2023-11-11 ENCOUNTER — Other Ambulatory Visit (HOSPITAL_COMMUNITY)

## 2023-11-11 ENCOUNTER — Observation Stay (HOSPITAL_BASED_OUTPATIENT_CLINIC_OR_DEPARTMENT_OTHER)

## 2023-11-11 ENCOUNTER — Encounter (HOSPITAL_COMMUNITY)
Admission: EM | Disposition: A | Discharge status: Home / Self Care | Payer: Self-pay | Source: Home / Self Care | Attending: Emergency Medicine

## 2023-11-11 DIAGNOSIS — I2510 Atherosclerotic heart disease of native coronary artery without angina pectoris: Secondary | ICD-10-CM | POA: Diagnosis not present

## 2023-11-11 DIAGNOSIS — I249 Acute ischemic heart disease, unspecified: Secondary | ICD-10-CM | POA: Diagnosis not present

## 2023-11-11 DIAGNOSIS — R609 Edema, unspecified: Secondary | ICD-10-CM | POA: Diagnosis not present

## 2023-11-11 DIAGNOSIS — I214 Non-ST elevation (NSTEMI) myocardial infarction: Secondary | ICD-10-CM | POA: Diagnosis not present

## 2023-11-11 HISTORY — PX: CORONARY ATHERECTOMY: CATH118238

## 2023-11-11 HISTORY — PX: CORONARY STENT INTERVENTION: CATH118234

## 2023-11-11 HISTORY — PX: LEFT HEART CATH AND CORONARY ANGIOGRAPHY: CATH118249

## 2023-11-11 HISTORY — PX: CORONARY ULTRASOUND/IVUS: CATH118244

## 2023-11-11 LAB — CBC
HCT: 50.8 % (ref 39.0–52.0)
Hemoglobin: 16.9 g/dL (ref 13.0–17.0)
MCH: 30.3 pg (ref 26.0–34.0)
MCHC: 33.3 g/dL (ref 30.0–36.0)
MCV: 91 fL (ref 80.0–100.0)
Platelets: 163 10*3/uL (ref 150–400)
RBC: 5.58 MIL/uL (ref 4.22–5.81)
RDW: 14.4 % (ref 11.5–15.5)
WBC: 6.6 10*3/uL (ref 4.0–10.5)
nRBC: 0 % (ref 0.0–0.2)

## 2023-11-11 LAB — HEPARIN LEVEL (UNFRACTIONATED): Heparin Unfractionated: 0.46 [IU]/mL (ref 0.30–0.70)

## 2023-11-11 LAB — POCT ACTIVATED CLOTTING TIME
Activated Clotting Time: 377 s
Activated Clotting Time: 441 s
Activated Clotting Time: 245 s
Activated Clotting Time: 285 s

## 2023-11-11 LAB — COMPREHENSIVE METABOLIC PANEL WITH GFR
ALT: 20 U/L (ref 0–44)
AST: 22 U/L (ref 15–41)
Albumin: 3.5 g/dL (ref 3.5–5.0)
Alkaline Phosphatase: 51 U/L (ref 38–126)
Anion gap: 13 (ref 5–15)
BUN: 20 mg/dL (ref 8–23)
CO2: 20 mmol/L — ABNORMAL LOW (ref 22–32)
Calcium: 9.1 mg/dL (ref 8.9–10.3)
Chloride: 108 mmol/L (ref 98–111)
Creatinine, Ser: 1.03 mg/dL (ref 0.61–1.24)
GFR, Estimated: >60 mL/min (ref >60)
Glucose, Bld: 92 mg/dL (ref 70–99)
Potassium: 4.5 mmol/L (ref 3.5–5.1)
Sodium: 141 mmol/L (ref 135–145)
Total Bilirubin: 1 mg/dL (ref 0.0–1.2)
Total Protein: 6.2 g/dL — ABNORMAL LOW (ref 6.5–8.1)

## 2023-11-11 SURGERY — LEFT HEART CATH AND CORONARY ANGIOGRAPHY
Anesthesia: LOCAL

## 2023-11-11 MED ORDER — HEPARIN SODIUM (PORCINE) 1000 UNIT/ML IJ SOLN
INTRAMUSCULAR | Status: DC | PRN
Start: 2023-11-11 — End: 2023-11-11
  Administered 2023-11-11 (×2): 5000 [IU] via INTRAVENOUS
  Administered 2023-11-11: 4000 [IU] via INTRAVENOUS

## 2023-11-11 MED ORDER — LABETALOL HCL 5 MG/ML IV SOLN: 10.0000 mg | INTRAVENOUS | Status: AC | PRN

## 2023-11-11 MED ORDER — HEPARIN SODIUM (PORCINE) 1000 UNIT/ML IJ SOLN
INTRAMUSCULAR | Status: AC
  Filled 2023-11-11: qty 10

## 2023-11-11 MED ORDER — LIDOCAINE HCL (PF) 1 % IJ SOLN
INTRAMUSCULAR | Status: DC | PRN
  Administered 2023-11-11: 5 mL

## 2023-11-11 MED ORDER — LIDOCAINE HCL (PF) 1 % IJ SOLN
INTRAMUSCULAR | Status: AC
  Filled 2023-11-11: qty 30

## 2023-11-11 MED ORDER — VERAPAMIL HCL 2.5 MG/ML IV SOLN
INTRAVENOUS | Status: AC
Start: 2023-11-11 — End: 2023-11-11
  Filled 2023-11-11: qty 2

## 2023-11-11 MED ORDER — MIDAZOLAM HCL 2 MG/2ML IJ SOLN
INTRAMUSCULAR | Status: AC
  Filled 2023-11-11: qty 2

## 2023-11-11 MED ORDER — ASPIRIN 81 MG PO CHEW: 81.0000 mg | CHEWABLE_TABLET | ORAL | Status: DC

## 2023-11-11 MED ORDER — NITROGLYCERIN 1 MG/10 ML FOR IR/CATH LAB
INTRA_ARTERIAL | Status: AC
  Filled 2023-11-11: qty 10

## 2023-11-11 MED ORDER — HEPARIN (PORCINE) IN NACL 1000-0.9 UT/500ML-% IV SOLN
INTRAVENOUS | Status: DC | PRN
  Administered 2023-11-11 (×2): 500 mL

## 2023-11-11 MED ORDER — SODIUM CHLORIDE 0.9% FLUSH
3.0000 mL | Freq: Two times a day (BID) | INTRAVENOUS | Status: DC
  Administered 2023-11-12: 3 mL via INTRAVENOUS

## 2023-11-11 MED ORDER — PRASUGREL HCL 10 MG PO TABS
10.0000 mg | ORAL_TABLET | Freq: Every day | ORAL | Status: DC
  Administered 2023-11-12: 10 mg via ORAL
  Filled 2023-11-11: qty 1

## 2023-11-11 MED ORDER — HYDRALAZINE HCL 20 MG/ML IJ SOLN: 10.0000 mg | INTRAMUSCULAR | Status: DC | PRN

## 2023-11-11 MED ORDER — FENTANYL CITRATE (PF) 100 MCG/2ML IJ SOLN
INTRAMUSCULAR | Status: AC
  Filled 2023-11-11: qty 2

## 2023-11-11 MED ORDER — SENNOSIDES-DOCUSATE SODIUM 8.6-50 MG PO TABS: 1.0000 | ORAL_TABLET | Freq: Every evening | ORAL | Status: DC | PRN

## 2023-11-11 MED ORDER — DIPHENHYDRAMINE HCL 50 MG/ML IJ SOLN: 25.0000 mg | Freq: Once | INTRAMUSCULAR | Status: AC

## 2023-11-11 MED ORDER — SODIUM CHLORIDE 0.9 % WEIGHT BASED INFUSION
1.0000 mL/kg/h | INTRAVENOUS | Status: AC
  Administered 2023-11-11 – 2023-11-12 (×2): 1 mL/kg/h via INTRAVENOUS

## 2023-11-11 MED ORDER — SODIUM CHLORIDE 0.9 % IV SOLN
5.0000 mg/kg | Freq: Once | INTRAVENOUS | Status: DC
  Filled 2023-11-11: qty 13.68

## 2023-11-11 MED ORDER — POLYETHYLENE GLYCOL 3350 17 G PO PACK
17.0000 g | PACK | Freq: Every day | ORAL | Status: DC
  Filled 2023-11-11 (×2): qty 1

## 2023-11-11 MED ORDER — SODIUM CHLORIDE 0.9 % IV SOLN
INTRAVENOUS | Status: AC | PRN
  Administered 2023-11-11: 250 mL via INTRAVENOUS

## 2023-11-11 MED ORDER — IPRATROPIUM-ALBUTEROL 0.5-2.5 (3) MG/3ML IN SOLN: 3.0000 mL | RESPIRATORY_TRACT | Status: DC | PRN

## 2023-11-11 MED ORDER — IOHEXOL 350 MG/ML SOLN
75.0000 mL | Freq: Once | INTRAVENOUS | Status: AC | PRN
  Administered 2023-11-11: 75 mL via INTRAVENOUS

## 2023-11-11 MED ORDER — MIDAZOLAM HCL 2 MG/2ML IJ SOLN
INTRAMUSCULAR | Status: DC | PRN
  Administered 2023-11-11: 2 mg via INTRAVENOUS
  Administered 2023-11-11: 1 mg via INTRAVENOUS

## 2023-11-11 MED ORDER — METOPROLOL TARTRATE 5 MG/5ML IV SOLN: 5.0000 mg | INTRAVENOUS | Status: DC | PRN

## 2023-11-11 MED ORDER — HYDRALAZINE HCL 20 MG/ML IJ SOLN: 10.0000 mg | INTRAMUSCULAR | Status: AC | PRN

## 2023-11-11 MED ORDER — METHYLPREDNISOLONE SODIUM SUCC 125 MG IJ SOLR
INTRAMUSCULAR | Status: AC
  Administered 2023-11-11: 125 mg via INTRAVENOUS
  Filled 2023-11-11: qty 2

## 2023-11-11 MED ORDER — MAGNESIUM SULFATE 2 GM/50ML IV SOLN
2.0000 g | Freq: Once | INTRAVENOUS | Status: AC
  Administered 2023-11-11: 2 g via INTRAVENOUS
  Filled 2023-11-11: qty 50

## 2023-11-11 MED ORDER — ONDANSETRON HCL 4 MG/2ML IJ SOLN
INTRAMUSCULAR | Status: DC | PRN
  Administered 2023-11-11: 4 mg via INTRAVENOUS

## 2023-11-11 MED ORDER — IOHEXOL 350 MG/ML SOLN
INTRAVENOUS | Status: DC | PRN
  Administered 2023-11-11: 150 mL

## 2023-11-11 MED ORDER — METHYLPREDNISOLONE SODIUM SUCC 125 MG IJ SOLR: 125.0000 mg | Freq: Once | INTRAMUSCULAR | Status: AC

## 2023-11-11 MED ORDER — PRASUGREL HCL 10 MG PO TABS
ORAL_TABLET | ORAL | Status: AC
  Filled 2023-11-11: qty 6

## 2023-11-11 MED ORDER — PRASUGREL HCL 10 MG PO TABS
ORAL_TABLET | ORAL | Status: DC | PRN
  Administered 2023-11-11: 60 mg via ORAL

## 2023-11-11 MED ORDER — SODIUM CHLORIDE 0.9% FLUSH: 3.0000 mL | INTRAVENOUS | Status: DC | PRN

## 2023-11-11 MED ORDER — VIPERSLIDE LUBRICANT OPTIME
TOPICAL | Status: DC | PRN
  Administered 2023-11-11: 20 mL via SURGICAL_CAVITY

## 2023-11-11 MED ORDER — SODIUM CHLORIDE 0.9 % IV SOLN: 250.0000 mL | INTRAVENOUS | Status: DC | PRN

## 2023-11-11 MED ORDER — ASPIRIN 81 MG PO CHEW: 81.0000 mg | CHEWABLE_TABLET | Freq: Every day | ORAL | Status: DC

## 2023-11-11 MED ORDER — ONDANSETRON HCL 4 MG/2ML IJ SOLN
INTRAMUSCULAR | Status: AC
  Filled 2023-11-11: qty 2

## 2023-11-11 MED ORDER — SODIUM CHLORIDE 0.9 % WEIGHT BASED INFUSION: 3.0000 mL/kg/h | INTRAVENOUS | Status: DC

## 2023-11-11 MED ORDER — GUAIFENESIN 100 MG/5ML PO LIQD: 5.0000 mL | ORAL | Status: DC | PRN

## 2023-11-11 MED ORDER — FENTANYL CITRATE (PF) 100 MCG/2ML IJ SOLN
INTRAMUSCULAR | Status: DC | PRN
Start: 2023-11-11 — End: 2023-11-11
  Administered 2023-11-11: 50 ug via INTRAVENOUS
  Administered 2023-11-11 (×2): 25 ug via INTRAVENOUS
  Administered 2023-11-11: 50 ug via INTRAVENOUS

## 2023-11-11 MED ORDER — VERAPAMIL HCL 2.5 MG/ML IV SOLN
INTRAVENOUS | Status: DC | PRN
  Administered 2023-11-11: 10 mL via INTRA_ARTERIAL

## 2023-11-11 MED ORDER — VERAPAMIL HCL 2.5 MG/ML IV SOLN
INTRAVENOUS | Status: AC
  Filled 2023-11-11: qty 2

## 2023-11-11 MED ORDER — SODIUM CHLORIDE 0.9 % WEIGHT BASED INFUSION
1.0000 mL/kg/h | INTRAVENOUS | Status: DC
Start: 2023-11-11 — End: 2023-11-11

## 2023-11-11 MED ORDER — NITROGLYCERIN 1 MG/10 ML FOR IR/CATH LAB
INTRA_ARTERIAL | Status: DC | PRN
  Administered 2023-11-11 (×2): 200 ug via INTRACORONARY

## 2023-11-11 MED ORDER — DIPHENHYDRAMINE HCL 50 MG/ML IJ SOLN
INTRAMUSCULAR | Status: AC
  Administered 2023-11-11: 25 mg via INTRAVENOUS
  Filled 2023-11-11: qty 1

## 2023-11-11 SURGICAL SUPPLY — 24 items
BALLOON SAPPHIRE NC24 4.0X15 (BALLOONS) IMPLANT
BALLOON SPRINTER MX OTW 1.5X12 (BALLOONS) IMPLANT
BALLOON TAKERU 2.0X12 (BALLOONS) IMPLANT
BALLOON ~~LOC~~ EMERGE MR 3.0X20 (BALLOONS) IMPLANT
CATH 5FR JL3.5 JR4 ANG PIG MP (CATHETERS) IMPLANT
CATH LAUNCHER 6FR AL1 (CATHETERS) IMPLANT
CATH OPTICROSS HD (CATHETERS) IMPLANT
CATH VISTA GUIDE 6FR JR4 ECOPK (CATHETERS) IMPLANT
CROWN DIAMONDBACK CLASSIC 1.25 (BURR) IMPLANT
DEVICE RAD COMP TR BAND LRG (VASCULAR PRODUCTS) IMPLANT
DRAPE IVUS SLED (BAG) IMPLANT
GLIDESHEATH SLEND SS 6F .021 (SHEATH) IMPLANT
GUIDEWIRE INQWIRE 1.5J.035X260 (WIRE) IMPLANT
KIT ENCORE 26 ADVANTAGE (KITS) IMPLANT
LUBRICANT VIPERSLIDE CORONARY (MISCELLANEOUS) IMPLANT
PACK CARDIAC CATHETERIZATION (CUSTOM PROCEDURE TRAY) ×1 IMPLANT
SET ATX-X65L (MISCELLANEOUS) IMPLANT
STENT MEGATRON 4.5X12 (Permanent Stent) IMPLANT
STENT SYNERGY XD 3.50X28 (Permanent Stent) IMPLANT
STENT SYNERGY XD 4.0X16 (Permanent Stent) IMPLANT
WIRE ASAHI PROWATER 180CM (WIRE) IMPLANT
WIRE ASAHI PROWATER 300CM (WIRE) IMPLANT
WIRE MAILMAN 300CM (WIRE) IMPLANT
WIRE VIPERWIRE COR FLEX .012 (WIRE) IMPLANT

## 2023-11-11 NOTE — Care Management Obs Status (Signed)
 MEDICARE OBSERVATION STATUS NOTIFICATION   Patient Details  Name: Steven Fisher. MRN: [id number] Date of Birth: [birth year]   Medicare Observation Status Notification Given:       [name] 11/11/2023, 11:09 AM

## 2023-11-11 NOTE — Progress Notes (Signed)
 Echo attempted, patient about to go for cath. Will reattempt as schedule permits. [facility] Arianni Gallego RDCS

## 2023-11-11 NOTE — TOC CM/SW Note (Addendum)
 Transition of Care [facility]) - Inpatient Brief Assessment   Patient Details  Name: Steven Fisher. MRN: [id number] Date of Birth: 04-08-1951  Transition of Care [facility]) CM/SW Contact:    Jennett Model, RN Phone Number: 11/11/2023, 12:09 PM   Clinical Narrative: From home alone,  has PCP and insurance on file, states has no HH services in place at this time or DME at home.  States daughter will transport them home at [employer] and she is support system, states gets medications from [facility] on [address]  Pta self ambulatory.  Patient gives this NCM permission to speak with his daughter.   He eats a regular diet, he has a scale at home.  Substance abuse resource added to AVS.   Transition of Care Asessment: Insurance and Status: Insurance coverage has been reviewed Patient has primary care physician: Yes Home environment has been reviewed: home alone Prior level of function:: indep Prior/Current Home Services: No current home services Social Drivers of Health Review: SDOH reviewed no interventions necessary Readmission risk has been reviewed: Yes Transition of care needs: no transition of care needs at this time

## 2023-11-11 NOTE — Progress Notes (Signed)
 BLE venous duplex has been completed.   Results can be found under chart review under CV PROC. 11/11/2023 10:39 AM Chau Savell RVT, RDMS

## 2023-11-11 NOTE — Progress Notes (Signed)
 PROGRESS NOTE    Steven Fisher.  ZOX:[id number] DOB: 24-Apr-1951 DOA: 11/10/2023 PCP: Scarlett Current, PA-C    Brief Narrative:   73 y.o. male with medical history significant of diverticulosis, diverticulitis, prediabetes, hyperlipidemia, hypertension, nephrolithiasis, polycythemia, hepatitis C, hypogonadism in male who presented to the emergency department with complaints of chest pain radiated to his neck and jaw associated with dyspnea, dizziness, nausea and emesis.  He has also had lower extremity edema since earlier this week.  No palpitations or diaphoresis.  The patient smokes about half a pack of cigarettes a day.  Patient found to have NSTEMI therefore transferred to [facility]  Assessment & Plan:  Principal Problem:   ACS (acute coronary syndrome) (HCC) Active Problems:   Essential hypertension   Hyperlipidemia LDL goal <130   Prediabetes   Alcohol abuse   Tobacco abuse   Elevated troponin   Elevated brain natriuretic peptide (BNP) level   Abnormal EKG   Chest pain concerning for NSTEMI Seen by cardiology team, aspirin , statin.  Started heparin drip. On Coreg - Plans for echocardiogram and LHC today      Essential hypertension Currently not on medication. Monitor blood pressure. IV as needed  Palpitations - Reporting of occasional palpitation but none captured possibly will need 30-day monitor upon discharge.  Will defer this to cardiology     Hyperlipidemia LDL goal <130 Started statin LDL 116 10/10/2023     Prediabetes A1c-6.0 10/10/2023     Alcohol abuse Alcohol withdrawal protocol Folate, MVI and thiamine. Consult TOC team.     Tobacco abuse Counseled to quit using  DVT prophylaxis: Heparin drip      Code Status: Full Code Family Communication: Daughter at bedside Plans for [facility]    Subjective:  Tells me currently is not having any chest pain. Smokes about 10 cigarettes daily for more than 50 years  Examination:  General exam:  Appears calm and comfortable  Respiratory system: Clear to auscultation. Respiratory effort normal. Cardiovascular system: S1 & S2 heard, RRR. No JVD, murmurs, rubs, gallops or clicks. No pedal edema. Gastrointestinal system: Abdomen is nondistended, soft and nontender. No organomegaly or masses felt. Normal bowel sounds heard. Central nervous system: Alert and oriented. No focal neurological deficits. Extremities: Symmetric 5 x 5 power. Skin: No rashes, lesions or ulcers Psychiatry: Judgement and insight appear normal. Mood & affect appropriate.                Diet Orders (From admission, onward)     Start     Ordered   11/11/23 0120  Diet NPO time specified  Diet effective now        11/11/23 0120            Objective: Vitals:   11/11/23 0038 11/11/23 0500 11/11/23 0621 11/11/23 1140  BP: 126/86 125/88  130/82  Pulse: (!) 53 (!) 54 60 (!) 56  Resp: 19 19  18   Temp: 98 F (36.7 C) 98.1 F (36.7 C)    TempSrc: Oral Oral    SpO2: 96% 97%  98%  Weight:  95.9 kg    Height:        Intake/Output Summary (Last 24 hours) at 11/11/2023 1353 Last data filed at 11/11/2023 1341 Gross per 24 hour  Intake 225.55 ml  Output --  Net 225.55 ml   Filed Weights   11/10/23 1159 11/10/23 1839 11/11/23 0500  Weight: 83.9 kg 96 kg 95.9 kg    Scheduled Meds:  [START ON  11/12/2023] aspirin   81 mg Oral Pre-Cath   aspirin  EC  81 mg Oral Daily   atorvastatin  40 mg Oral Daily   carvedilol  6.25 mg Oral BID WC   polyethylene glycol  17 g Oral Daily   Continuous Infusions:  sodium chloride      Followed by   sodium chloride      heparin 1,100 Units/hr (11/11/23 1330)    Nutritional status     Body mass index is 32.15 kg/m.  Data Reviewed:   CBC: Recent Labs  Lab 11/10/23 1311 11/11/23 0815  WBC 7.3 6.6  NEUTROABS 3.7  --   HGB 16.7 16.9  HCT 50.5 50.8  MCV 91.7 91.0  PLT 198 163   Basic Metabolic Panel: Recent Labs  Lab 11/10/23 1311 11/10/23 1858  11/11/23 0815  NA 138  --  141  K 4.1  --  4.5  CL 107  --  108  CO2 22  --  20*  GLUCOSE 111*  --  92  BUN 19  --  20  CREATININE 1.04  --  1.03  CALCIUM 9.4  --  9.1  MG  --  1.9  --   PHOS  --  4.2  --    GFR: Estimated Creatinine Clearance: 72.8 mL/min (by C-G formula based on SCr of 1.03 mg/dL). Liver Function Tests: Recent Labs  Lab 11/10/23 1311 11/11/23 0815  AST 19 22  ALT 17 20  ALKPHOS 63 51  BILITOT 0.7 1.0  PROT 6.7 6.2*  ALBUMIN 3.8 3.5   No results for input(s): LIPASE, AMYLASE in the last 168 hours. No results for input(s): AMMONIA in the last 168 hours. Coagulation Profile: No results for input(s): INR, PROTIME in the last 168 hours. Cardiac Enzymes: No results for input(s): CKTOTAL, CKMB, CKMBINDEX, TROPONINI in the last 168 hours. BNP (last 3 results) No results for input(s): PROBNP in the last 8760 hours. HbA1C: No results for input(s): HGBA1C in the last 72 hours. CBG: No results for input(s): GLUCAP in the last 168 hours. Lipid Profile: No results for input(s): CHOL, HDL, LDLCALC, TRIG, CHOLHDL, LDLDIRECT in the last 72 hours. Thyroid Function Tests: No results for input(s): TSH, T4TOTAL, FREET4, T3FREE, THYROIDAB in the last 72 hours. Anemia Panel: No results for input(s): VITAMINB12, FOLATE, FERRITIN, TIBC, IRON, RETICCTPCT in the last 72 hours. Sepsis Labs: No results for input(s): PROCALCITON, LATICACIDVEN in the last 168 hours.  No results found for this or any previous visit (from the past 240 hours).       Radiology Studies: VAS US  LOWER EXTREMITY VENOUS (DVT) (7a-7p) Result Date: 11/11/2023  Lower Venous DVT Study Patient Name:  Dow Blahnik.  Date of Exam:   11/11/2023 Medical Rec #: [id number]          Accession #:    [id number] Date of Birth: 12/16/1950          Patient Gender: M Patient Age:   42 years Exam Location:  [facility] Procedure:      VAS US  LOWER  EXTREMITY VENOUS (DVT) Referring Phys: Donata Fryer MESSICK --------------------------------------------------------------------------------  Indications: Edema.  Comparison Study: No previous exams Performing Technologist: Jody Hill RVT, RDMS  Examination Guidelines: A complete evaluation includes B-mode imaging, spectral Doppler, color Doppler, and power Doppler as needed of all accessible portions of each vessel. Bilateral testing is considered an integral part of a complete examination. Limited examinations for reoccurring indications may be performed as noted. The reflux portion of the exam is  performed with the patient in reverse Trendelenburg.  +---------+---------------+---------+-----------+----------+--------------+ RIGHT    CompressibilityPhasicitySpontaneityPropertiesThrombus Aging +---------+---------------+---------+-----------+----------+--------------+ CFV      Full           Yes      Yes                                 +---------+---------------+---------+-----------+----------+--------------+ SFJ      Full                                                        +---------+---------------+---------+-----------+----------+--------------+ FV Prox  Full           Yes      Yes                                 +---------+---------------+---------+-----------+----------+--------------+ FV Mid   Full           Yes      Yes                                 +---------+---------------+---------+-----------+----------+--------------+ FV DistalFull           Yes      Yes                                 +---------+---------------+---------+-----------+----------+--------------+ PFV      Full                                                        +---------+---------------+---------+-----------+----------+--------------+ POP      Full           Yes      Yes                                 +---------+---------------+---------+-----------+----------+--------------+ PTV       Full                                                        +---------+---------------+---------+-----------+----------+--------------+ PERO     Full                                                        +---------+---------------+---------+-----------+----------+--------------+   +---------+---------------+---------+-----------+----------+--------------+ LEFT     CompressibilityPhasicitySpontaneityPropertiesThrombus Aging +---------+---------------+---------+-----------+----------+--------------+ CFV      Full           Yes      Yes                                 +---------+---------------+---------+-----------+----------+--------------+  SFJ      Full                                                        +---------+---------------+---------+-----------+----------+--------------+ FV Prox  Full           Yes      Yes                                 +---------+---------------+---------+-----------+----------+--------------+ FV Mid   Full           Yes      Yes                                 +---------+---------------+---------+-----------+----------+--------------+ FV DistalFull           Yes      Yes                                 +---------+---------------+---------+-----------+----------+--------------+ PFV      Full                                                        +---------+---------------+---------+-----------+----------+--------------+ POP      Full           Yes      Yes                                 +---------+---------------+---------+-----------+----------+--------------+ PTV      Full                                                        +---------+---------------+---------+-----------+----------+--------------+ PERO     Full                                                        +---------+---------------+---------+-----------+----------+--------------+     Summary: BILATERAL: - No evidence of deep vein  thrombosis seen in the lower extremities, bilaterally. -No evidence of popliteal cyst, bilaterally.   *See table(s) above for measurements and observations. Electronically signed by Genny Kid MD on 11/11/2023 at 11:54:23 AM.    Final    DG Chest 2 View Result Date: 11/10/2023 CLINICAL DATA:  Short of breath, chest pain for 1 week EXAM: CHEST - 2 VIEW COMPARISON:  06/13/2013 FINDINGS: Frontal and lateral views of the chest demonstrate an unremarkable cardiac silhouette. No acute airspace disease, effusion, or pneumothorax. No acute bony abnormalities. IMPRESSION: 1. No acute intrathoracic process. Electronically Signed   By: Bobbye Burrow M.D.   On: 11/10/2023 13:04  LOS: 0 days   Time spent= 35 mins    Maggie Schooner, MD [facility]  If 7PM-7AM, please contact night-coverage  11/11/2023, 1:53 PM

## 2023-11-11 NOTE — Plan of Care (Signed)
  Problem: Education: Goal: Understanding of cardiac disease, CV risk reduction, and recovery process will improve 11/11/2023 0105 by Rennie Casco, RN Outcome: Progressing 11/11/2023 0105 by Rennie Casco, RN Outcome: Progressing Goal: Individualized Educational Video(s) Outcome: Progressing   Problem: Activity: Goal: Ability to tolerate increased activity will improve 11/11/2023 0105 by Rennie Casco, RN Outcome: Progressing 11/11/2023 0105 by Rennie Casco, RN Outcome: Progressing   Problem: Cardiac: Goal: Ability to achieve and maintain adequate cardiovascular perfusion will improve Outcome: Progressing   Problem: Health Behavior/Discharge Planning: Goal: Ability to safely manage health-related needs after discharge will improve Outcome: Progressing   Problem: Education: Goal: Knowledge of General Education information will improve Description: Including pain rating scale, medication(s)/side effects and non-pharmacologic comfort measures Outcome: Progressing   Problem: Health Behavior/Discharge Planning: Goal: Ability to manage health-related needs will improve Outcome: Progressing   Problem: Clinical Measurements: Goal: Ability to maintain clinical measurements within normal limits will improve Outcome: Progressing Goal: Will remain free from infection Outcome: Progressing Goal: Diagnostic test results will improve Outcome: Progressing Goal: Respiratory complications will improve Outcome: Progressing Goal: Cardiovascular complication will be avoided Outcome: Progressing   Problem: Activity: Goal: Risk for activity intolerance will decrease Outcome: Progressing   Problem: Nutrition: Goal: Adequate nutrition will be maintained 11/11/2023 0105 by Rennie Casco, RN Outcome: Progressing 11/11/2023 0105 by Rennie Casco, RN Outcome: Progressing   Problem: Coping: Goal: Level of anxiety will decrease Outcome:  Progressing   Problem: Elimination: Goal: Will not experience complications related to bowel motility Outcome: Progressing Goal: Will not experience complications related to urinary retention Outcome: Progressing   Problem: Pain Managment: Goal: General experience of comfort will improve and/or be controlled Outcome: Progressing   Problem: Safety: Goal: Ability to remain free from injury will improve Outcome: Progressing   Problem: Skin Integrity: Goal: Risk for impaired skin integrity will decrease Outcome: Progressing

## 2023-11-11 NOTE — H&P (View-Only) (Signed)
 Progress Note  Patient Name: Steven Fisher. Date of Encounter: 11/11/2023 [facility] Cardiologist: None  Interval Summary    No chest pain this morning, family at bedside.   Vital Signs Vitals:   11/10/23 2042 11/11/23 0038 11/11/23 0500 11/11/23 0621  BP: 139/87 126/86 125/88   Pulse:  (!) 53 (!) 54 60  Resp:  19 19   Temp:  98 F (36.7 C) 98.1 F (36.7 C)   TempSrc:  Oral Oral   SpO2:  96% 97%   Weight:   95.9 kg   Height:        Intake/Output Summary (Last 24 hours) at 11/11/2023 0940 Last data filed at 11/11/2023 0319 Gross per 24 hour  Intake 78.99 ml  Output --  Net 78.99 ml      11/11/2023    5:00 AM 11/10/2023    6:39 PM 11/10/2023   11:59 AM  Last 3 Weights  Weight (lbs) 211 lb 6.7 oz 211 lb 10.3 oz 185 lb  Weight (kg) 95.9 kg 96 kg 83.915 kg      Telemetry/ECG   Sinus Rhythm - Personally Reviewed  Physical Exam  GEN: No acute distress.   Neck: No JVD Cardiac: RRR, no murmurs, rubs, or gallops.  Respiratory: Clear to auscultation bilaterally. GI: Soft, nontender, non-distended  MS: 1-2+ bilateral LE pitting edema  Assessment & Plan   73 y.o. male with a significant history of self reported atrial fibrillation, cigarette use, prior hypertension now resolved per patient, hypercholesteremia, pre-diabetes, alcohol misuse, diverticulosis complicated by prior  diverticulitis, polycythemia, nephrolithiasis, and hepatitis C who was seen 11/10/2023 for the evaluation of chest pain at the request of Dr. Lucienne Ryder.   Chest pain NSTEMI -- Presented with intermittent episodes of chest pain concerning for angina.  High-sensitivity troponin 250>> 242.  EKG showed sinus rhythm, 67 bpm, T wave inversion in lateral leads. No chest pain this morning. -- Continue IV heparin, aspirin , atorvastatin and Coreg -- planned for cardiac catheterization today -- Echocardiogram pending  HTN -- Initially elevated on admission, now improved -- Continue Coreg  6.25 mg twice daily  HLD -- LDL 116, HDL 50, LPa pending -- Continue atorvastatin 40 mg daily  Possible paroxysmal atrial fibrillation -- Reports he was told by EMS previously last year in the setting of palpitations -- No documented episodes noted in the chart, denies ever having been told by physician. -- Does report prior episodes of periodic palpitations -- Would plan for cardiac monitor at discharge  Tobacco use -- cessation advised  ETOH use -- on CIWA protocol   Pre DM -- Hgb A1c 6.0 (09/2023)  For questions or updates, please contact [facility] Please consult [email or web address] for contact info under       Signed, Johnie Nailer, NP   Patient seen, examined. Available data reviewed. Agree with findings, assessment, and plan as outlined by Johnie Nailer, NP.  73 year old male with non-STEMI.  Plans noted for cardiac catheterization and possible PCI today.  On my independent evaluation, the patient is alert, oriented, in no distress.  HEENT is normal, JVP is normal, lungs are clear bilaterally, heart is regular rate and rhythm no murmur gallop, abdomen soft nontender, extremities have 1+ bilateral edema, skin is warm and dry.  Telemetry shows sinus rhythm with no significant arrhythmia.  Comorbid conditions are well outlined above.  I agree with monitoring him at discharge as he has not had clear atrial fibrillation captured on telemetry.  Patient is scheduled to  have an echocardiogram and cardiac catheterization today.  I personally reviewed the risks, indications, and alternatives to cardiac catheterization and possible PCI with the patient.  He understands the risk of serious complications such as vascular injury, stroke, myocardial infarction, need for emergency surgery, and death occurs at a low incidence of less than 1%.  Further plan/disposition pending his cardiac catheterization result.  Of note, he reports that he had severe chest pain last Wednesday and is  possible that his troponin is now trending down from an event last week.  Further disposition pending his cardiac catheterization result.  Arnoldo Lapping, M.D. 11/11/2023 10:56 AM

## 2023-11-11 NOTE — Interval H&P Note (Signed)
 History and Physical Interval Note:  11/11/2023 2:30 PM  Steven Fisher.  has presented today for surgery, with the diagnosis of nstemi.  The various methods of treatment have been discussed with the patient and family. After consideration of risks, benefits and other options for treatment, the patient has consented to  Procedure(s): LEFT HEART CATH AND CORONARY ANGIOGRAPHY (N/A) as a surgical intervention.  The patient's history has been reviewed, patient examined, no change in status, stable for surgery.  I have reviewed the patient's chart and labs.  Questions were answered to the patient's satisfaction.     Arnoldo Lapping

## 2023-11-11 NOTE — Progress Notes (Addendum)
 Progress Note  Patient Name: Steven Fisher. Date of Encounter: 11/11/2023 [facility] Cardiologist: None  Interval Summary    No chest pain this morning, family at bedside.   Vital Signs Vitals:   11/10/23 2042 11/11/23 0038 11/11/23 0500 11/11/23 0621  BP: 139/87 126/86 125/88   Pulse:  (!) 53 (!) 54 60  Resp:  19 19   Temp:  98 F (36.7 C) 98.1 F (36.7 C)   TempSrc:  Oral Oral   SpO2:  96% 97%   Weight:   95.9 kg   Height:        Intake/Output Summary (Last 24 hours) at 11/11/2023 0940 Last data filed at 11/11/2023 0319 Gross per 24 hour  Intake 78.99 ml  Output --  Net 78.99 ml      11/11/2023    5:00 AM 11/10/2023    6:39 PM 11/10/2023   11:59 AM  Last 3 Weights  Weight (lbs) 211 lb 6.7 oz 211 lb 10.3 oz 185 lb  Weight (kg) 95.9 kg 96 kg 83.915 kg      Telemetry/ECG   Sinus Rhythm - Personally Reviewed  Physical Exam  GEN: No acute distress.   Neck: No JVD Cardiac: RRR, no murmurs, rubs, or gallops.  Respiratory: Clear to auscultation bilaterally. GI: Soft, nontender, non-distended  MS: 1-2+ bilateral LE pitting edema  Assessment & Plan   73 y.o. male with a significant history of self reported atrial fibrillation, cigarette use, prior hypertension now resolved per patient, hypercholesteremia, pre-diabetes, alcohol misuse, diverticulosis complicated by prior  diverticulitis, polycythemia, nephrolithiasis, and hepatitis C who was seen 11/10/2023 for the evaluation of chest pain at the request of Dr. Lucienne Ryder.   Chest pain NSTEMI -- Presented with intermittent episodes of chest pain concerning for angina.  High-sensitivity troponin 250>> 242.  EKG showed sinus rhythm, 67 bpm, T wave inversion in lateral leads. No chest pain this morning. -- Continue IV heparin, aspirin , atorvastatin and Coreg -- planned for cardiac catheterization today -- Echocardiogram pending  HTN -- Initially elevated on admission, now improved -- Continue Coreg  6.25 mg twice daily  HLD -- LDL 116, HDL 50, LPa pending -- Continue atorvastatin 40 mg daily  Possible paroxysmal atrial fibrillation -- Reports he was told by EMS previously last year in the setting of palpitations -- No documented episodes noted in the chart, denies ever having been told by physician. -- Does report prior episodes of periodic palpitations -- Would plan for cardiac monitor at discharge  Tobacco use -- cessation advised  ETOH use -- on CIWA protocol   Pre DM -- Hgb A1c 6.0 (09/2023)  For questions or updates, please contact [facility] Please consult [email or web address] for contact info under       Signed, Johnie Nailer, NP   Patient seen, examined. Available data reviewed. Agree with findings, assessment, and plan as outlined by Johnie Nailer, NP.  73 year old male with non-STEMI.  Plans noted for cardiac catheterization and possible PCI today.  On my independent evaluation, the patient is alert, oriented, in no distress.  HEENT is normal, JVP is normal, lungs are clear bilaterally, heart is regular rate and rhythm no murmur gallop, abdomen soft nontender, extremities have 1+ bilateral edema, skin is warm and dry.  Telemetry shows sinus rhythm with no significant arrhythmia.  Comorbid conditions are well outlined above.  I agree with monitoring him at discharge as he has not had clear atrial fibrillation captured on telemetry.  Patient is scheduled to  have an echocardiogram and cardiac catheterization today.  I personally reviewed the risks, indications, and alternatives to cardiac catheterization and possible PCI with the patient.  He understands the risk of serious complications such as vascular injury, stroke, myocardial infarction, need for emergency surgery, and death occurs at a low incidence of less than 1%.  Further plan/disposition pending his cardiac catheterization result.  Of note, he reports that he had severe chest pain last Wednesday and is  possible that his troponin is now trending down from an event last week.  Further disposition pending his cardiac catheterization result.  Arnoldo Lapping, M.D. 11/11/2023 10:56 AM

## 2023-11-11 NOTE — Hospital Course (Addendum)
 Brief Narrative:   73 y.o. male with medical history significant of diverticulosis, diverticulitis, prediabetes, hyperlipidemia, hypertension, nephrolithiasis, polycythemia, hepatitis C, hypogonadism in male who presented to the emergency department with complaints of chest pain radiated to his neck and jaw associated with dyspnea, dizziness, nausea and emesis.  He has also had lower extremity edema since earlier this week.  No palpitations or diaphoresis.  The patient smokes about half a pack of cigarettes a day.  Patient found to have NSTEMI therefore transferred to [facility].  Assessment & Plan:  Principal Problem:   ACS (acute coronary syndrome) (HCC) Active Problems:   Essential hypertension   Hyperlipidemia LDL goal <130   Prediabetes   Alcohol abuse   Tobacco abuse   Elevated troponin   Elevated brain natriuretic peptide (BNP) level   Abnormal EKG   Chest pain concerning for NSTEMI Seen by cardiology team, aspirin , statin.  Started heparin drip. On Coreg - Plans for echocardiogram and LHC today      Essential hypertension Currently not on medication. Monitor blood pressure. IV as needed  Palpitations - Reporting of occasional palpitation but none captured possibly will need 30-day monitor upon discharge.  Will defer this to cardiology     Hyperlipidemia LDL goal <130 Started statin LDL 116 10/10/2023     Prediabetes A1c-6.0 10/10/2023     Alcohol abuse Alcohol withdrawal protocol Folate, MVI and thiamine. Consult TOC team.     Tobacco abuse Counseled to quit using  DVT prophylaxis: Heparin drip      Code Status: Full Code Family Communication: Daughter at bedside Plans for [facility]    Subjective:  Tells me currently is not having any chest pain. Smokes about 10 cigarettes daily for more than 50 years  Examination:  General exam: Appears calm and comfortable  Respiratory system: Clear to auscultation. Respiratory effort normal. Cardiovascular  system: S1 & S2 heard, RRR. No JVD, murmurs, rubs, gallops or clicks. No pedal edema. Gastrointestinal system: Abdomen is nondistended, soft and nontender. No organomegaly or masses felt. Normal bowel sounds heard. Central nervous system: Alert and oriented. No focal neurological deficits. Extremities: Symmetric 5 x 5 power. Skin: No rashes, lesions or ulcers Psychiatry: Judgement and insight appear normal. Mood & affect appropriate.

## 2023-11-11 NOTE — Progress Notes (Signed)
 PHARMACY - ANTICOAGULATION CONSULT NOTE  Pharmacy Consult for heparin Indication: chest pain/ACS  Allergies  Allergen Reactions   Iohexol  Anaphylaxis    Had contrast x 20 years ago, throat closed up   Penicillins Anaphylaxis    Patient Measurements: Height: 5' 8 (172.7 cm) Weight: 95.9 kg (211 lb 6.7 oz) IBW/kg (Calculated) : 68.4 HEPARIN DW (KG): 88.7  Vital Signs: Temp: 98.1 F (36.7 C) (06/16 0500) Temp Source: Oral (06/16 0500) BP: 130/82 (06/16 1140) Pulse Rate: 56 (06/16 1140)  Labs: Recent Labs    11/10/23 1311 11/10/23 1419 11/11/23 0815  HGB 16.7  --  16.9  HCT 50.5  --  50.8  PLT 198  --  163  HEPARINUNFRC  --   --  0.46  CREATININE 1.04  --   --   TROPONINIHS 250* 242*  --     Estimated Creatinine Clearance: 72.1 mL/min (by C-G formula based on SCr of 1.04 mg/dL).   Medical History: Past Medical History:  Diagnosis Date   Diverticulitis    Hypercholesteremia    Hypertension    Renal stones     Medications:  Medications Prior to Admission  Medication Sig Dispense Refill Last Dose/Taking   calcium carbonate (TUMS - DOSED IN MG ELEMENTAL CALCIUM) 500 MG chewable tablet Chew 2-4 tablets by mouth daily.   11/09/2023   ibuprofen (ADVIL) 800 MG tablet Take 800 mg by mouth daily.   11/09/2023   LORazepam  (ATIVAN ) 1 MG tablet Take by mouth.   Unknown   Omega-3 Fatty Acids (FISH OIL OMEGA-3 PO) Take 2,800 mg by mouth daily.   11/09/2023   AMBULATORY NON FORMULARY MEDICATION Medication Name: Nitroglycerine ointment 0.125 %  Apply a pea sized amount internally four times daily. Dispense 30 GM zero refill (Patient not taking: Reported on 11/10/2023) 30 g 1 Not Taking   hydrochlorothiazide (HYDRODIURIL) 25 MG tablet Take by mouth. (Patient not taking: Reported on 11/10/2023)   Not Taking   losartan (COZAAR) 100 MG tablet Take 100 mg by mouth daily. (Patient not taking: Reported on 11/10/2023)   Not Taking   polyethylene glycol (MIRALAX ) 17 g packet Take 17 g by  mouth daily. (Patient not taking: Reported on 11/10/2023) 14 each 0 Not Taking    Assessment: 73 yom who presented to the [facility] ED with chest pain and SOB for a week. Pharmacy consulted to begin IV heparin. He does have a hx self reported Afib and is not on anticoagulation at home. He had enoxaparin 40 mg SQ at 18:53. Renal function and CBC are normal. Troponin is elevated at 242.  Heparin level 0.46 therapeutic on 1100 units/hr.  CBC stable.  LHC today  Goal of Therapy:  Heparin level 0.3-0.7 units/ml Monitor platelets by anticoagulation protocol: Yes   Plan:  Continue heparin IV 1100 units/hr F/u after cath today  Thank you for involving pharmacy in this patient's care.  Cecillia Cogan, PharmD Clinical Pharmacist 11/11/2023  12:06 PM

## 2023-11-12 ENCOUNTER — Other Ambulatory Visit: Payer: Self-pay | Admitting: Cardiology

## 2023-11-12 ENCOUNTER — Other Ambulatory Visit (HOSPITAL_COMMUNITY)

## 2023-11-12 ENCOUNTER — Other Ambulatory Visit (HOSPITAL_COMMUNITY): Payer: Self-pay

## 2023-11-12 ENCOUNTER — Telehealth: Payer: Self-pay | Admitting: Cardiology

## 2023-11-12 DIAGNOSIS — Z79899 Other long term (current) drug therapy: Secondary | ICD-10-CM | POA: Diagnosis not present

## 2023-11-12 DIAGNOSIS — I214 Non-ST elevation (NSTEMI) myocardial infarction: Principal | ICD-10-CM

## 2023-11-12 DIAGNOSIS — F17210 Nicotine dependence, cigarettes, uncomplicated: Secondary | ICD-10-CM | POA: Diagnosis present

## 2023-11-12 DIAGNOSIS — I9788 Other intraoperative complications of the circulatory system, not elsewhere classified: Secondary | ICD-10-CM | POA: Diagnosis not present

## 2023-11-12 DIAGNOSIS — E7800 Pure hypercholesterolemia, unspecified: Secondary | ICD-10-CM | POA: Diagnosis present

## 2023-11-12 DIAGNOSIS — I2510 Atherosclerotic heart disease of native coronary artery without angina pectoris: Secondary | ICD-10-CM | POA: Diagnosis present

## 2023-11-12 DIAGNOSIS — Y840 Cardiac catheterization as the cause of abnormal reaction of the patient, or of later complication, without mention of misadventure at the time of the procedure: Secondary | ICD-10-CM | POA: Diagnosis not present

## 2023-11-12 DIAGNOSIS — F1010 Alcohol abuse, uncomplicated: Secondary | ICD-10-CM | POA: Diagnosis present

## 2023-11-12 DIAGNOSIS — I2542 Coronary artery dissection: Secondary | ICD-10-CM | POA: Diagnosis not present

## 2023-11-12 DIAGNOSIS — I249 Acute ischemic heart disease, unspecified: Secondary | ICD-10-CM | POA: Diagnosis present

## 2023-11-12 DIAGNOSIS — Z8249 Family history of ischemic heart disease and other diseases of the circulatory system: Secondary | ICD-10-CM | POA: Diagnosis not present

## 2023-11-12 DIAGNOSIS — I4891 Unspecified atrial fibrillation: Secondary | ICD-10-CM | POA: Diagnosis present

## 2023-11-12 DIAGNOSIS — I10 Essential (primary) hypertension: Secondary | ICD-10-CM | POA: Diagnosis present

## 2023-11-12 DIAGNOSIS — R7303 Prediabetes: Secondary | ICD-10-CM | POA: Diagnosis present

## 2023-11-12 LAB — CBC
HCT: 47.7 % (ref 39.0–52.0)
Hemoglobin: 16 g/dL (ref 13.0–17.0)
MCH: 29.9 pg (ref 26.0–34.0)
MCHC: 33.5 g/dL (ref 30.0–36.0)
MCV: 89 fL (ref 80.0–100.0)
Platelets: 183 10*3/uL (ref 150–400)
RBC: 5.36 MIL/uL (ref 4.22–5.81)
RDW: 14.1 % (ref 11.5–15.5)
WBC: 11 10*3/uL — ABNORMAL HIGH (ref 4.0–10.5)
nRBC: 0 % (ref 0.0–0.2)

## 2023-11-12 LAB — BASIC METABOLIC PANEL WITH GFR
Anion gap: 8 (ref 5–15)
BUN: 19 mg/dL (ref 8–23)
CO2: 22 mmol/L (ref 22–32)
Calcium: 8.8 mg/dL — ABNORMAL LOW (ref 8.9–10.3)
Chloride: 109 mmol/L (ref 98–111)
Creatinine, Ser: 1.04 mg/dL (ref 0.61–1.24)
GFR, Estimated: >60 mL/min (ref >60)
Glucose, Bld: 158 mg/dL — ABNORMAL HIGH (ref 70–99)
Potassium: 4.2 mmol/L (ref 3.5–5.1)
Sodium: 139 mmol/L (ref 135–145)

## 2023-11-12 LAB — MAGNESIUM: Magnesium: 2.1 mg/dL (ref 1.7–2.4)

## 2023-11-12 LAB — LIPOPROTEIN A (LPA): Lipoprotein (a): 115.8 nmol/L — ABNORMAL HIGH (ref <75.0)

## 2023-11-12 MED ORDER — PRASUGREL HCL 10 MG PO TABS
10.0000 mg | ORAL_TABLET | Freq: Every day | ORAL | 3 refills | Status: AC
  Filled 2023-11-12: qty 90, 90d supply, fill #0

## 2023-11-12 MED ORDER — CARVEDILOL 6.25 MG PO TABS
6.2500 mg | ORAL_TABLET | Freq: Two times a day (BID) | ORAL | 3 refills | Status: DC
  Filled 2023-11-12: qty 180, 90d supply, fill #0

## 2023-11-12 MED ORDER — NITROGLYCERIN 0.4 MG SL SUBL
0.4000 mg | SUBLINGUAL_TABLET | SUBLINGUAL | 3 refills | Status: AC | PRN
  Filled 2023-11-12: qty 25, 7d supply, fill #0

## 2023-11-12 MED ORDER — ROSUVASTATIN CALCIUM 20 MG PO TABS
20.0000 mg | ORAL_TABLET | Freq: Every day | ORAL | 1 refills | Status: AC
  Filled 2023-11-12: qty 90, 90d supply, fill #0

## 2023-11-12 MED ORDER — ASPIRIN 81 MG PO TBEC
81.0000 mg | DELAYED_RELEASE_TABLET | Freq: Every day | ORAL | Status: DC
  Administered 2023-11-12: 81 mg via ORAL
  Filled 2023-11-12: qty 1

## 2023-11-12 MED ORDER — ASPIRIN 81 MG PO TBEC
81.0000 mg | DELAYED_RELEASE_TABLET | Freq: Every day | ORAL | 12 refills | Status: AC
  Filled 2023-11-12: qty 30, 30d supply, fill #0

## 2023-11-12 MED ORDER — ROSUVASTATIN CALCIUM 20 MG PO TABS
20.0000 mg | ORAL_TABLET | Freq: Every day | ORAL | Status: DC
  Administered 2023-11-12: 20 mg via ORAL
  Filled 2023-11-12: qty 1

## 2023-11-12 MED ORDER — ROSUVASTATIN CALCIUM 20 MG PO TABS: 20.0000 mg | ORAL_TABLET | Freq: Every day | ORAL | Status: DC

## 2023-11-12 MED FILL — Heparin Sodium (Porcine) Inj 1000 Unit/ML: INTRAMUSCULAR | Qty: 10 | Status: AC

## 2023-11-12 NOTE — Discharge Summary (Signed)
 Discharge Summary   Patient ID: Steven Fisher. MRN: [id number]; DOB: 07-Feb-1951  Admit date: 11/10/2023 Discharge date: 11/12/2023  PCP:  Scarlett Current, PA-C   [facility] Providers Cardiologist:  Arnoldo Lapping, MD    Discharge Diagnoses  Principal Problem:   ACS (acute coronary syndrome) [facility]) Active Problems:   Essential hypertension   Hyperlipidemia LDL goal <130   Prediabetes   Alcohol abuse   Tobacco abuse   Elevated troponin   Elevated brain natriuretic peptide (BNP) level   Abnormal EKG   NSTEMI (non-ST elevated myocardial infarction) [facility])   Diagnostic Studies/Procedures   Cath: 11/11/2023  1.  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  A total of 4 stents are deployed (3.5 x 28 mm Synergy, 3.5 x 28 mm Synergy, 4.0 x 12 mm Synergy, 4.5 x 12 mm Megatron in the ostium) 2.  Patent left main, LAD, and left circumflex with mild diffuse plaquing but no obstruction 3.  Normal LVEF and normal LVEDP with no regional wall motion abnormalities   Recommendations: Gated CTA of the chest to make sure that contrast staining/dissection is localized in the right coronary cusp.  This study will be performed stat.  Otherwise aggressive medical therapy and continue DAPT with aspirin  and prasugrel.   Diagnostic Dominance: Right  Intervention     ____________   History of Present Illness   Steven Puccinelli. is a 73 y.o. male with a significant history of self reported atrial fibrillation, cigarette use, prior hypertension now resolved per patient, hypercholesteremia, pre-diabetes, alcohol misuse, diverticulosis complicated by prior  diverticulitis, polycythemia, nephrolithiasis, and hepatitis C who was seen 11/10/2023 for the evaluation of chest pain at the request of Dr. Lucienne Ryder.   Steven Fisher reported around 2 weeks ago he noticed sudden chest discomfort that felt like an ache at the center of his chest, going to his jaw, neck, and left shoulder.  The pain was not in the setting of doing anything strenuous, lasted around 30 minutes, and went away with rest.  His pain was associated with dizziness, nausea, and shortness of breath.  Since then, he had noticed chest pain daily that's sporadic and lasting only 30 minutes.  He took baby aspirin  to help with the pain at home.  He recently noticed swelling in his legs as well.  Due to not feeling well, his son in-law brought him to [facility] for further evaluation. He is currently chest pain free.     When seen at [facility], he was noted to be hypertensive 153/98 mmHg but hemodynamically stable.  ECG revealed sinus rhythm with non-specific T-wave abnormality.  Initial HS-troponin was 250 ng/L and went to 242 ng/L.  Patient was given high-dose aspirin  but was not placed on heparin drip.  Patient was discussed with Dr. Marven Slimmer with cardiology and it was decided to have patient transferred to our facility with cardiology to consult.     Of note, Steven Fisher reported being told by emergency medical services that he had atrial fibrillation.  He reported periodic episodes of a fluttering feeling in his chest often associated with dizziness and shortness of breath.  Patient states that physician provider has never given him a formal diagnosis of atrial fibrillation.    Hospital Course    Chest pain NSTEMI -- Presented  with intermittent episodes of chest pain concerning for angina.  High-sensitivity troponin 250>> 242.  EKG showed sinus rhythm, 67 bpm, T wave inversion in lateral leads.  Underwent cardiac catheterization 6/16 with severe complex calcified stenosis of RCA treated with orbital atherectomy and extensive stenting requiring DES x 4.  Complicated by coronary dissection which extended back into the right  coronary cusp of aortic root.  Gated CT of chest showed extravasated contrast after dissection of right coronary artery but no evidence of additional contrast extravasation beyond site.  Recommendations for DAPT with aspirin /Effient for at least 1 year, likely longer given the degree of stenting of the RCA.  No complications noted overnight, no chest pain with ambulation.  Seen by cardiac rehab. -- Continue aspirin , Effient, Crestor and Coreg -- will need outpatient echo at follow up visit (patient was unwilling to stay to have done inpatient prior to discharge), of note normal LVEF on LV gram   HTN -- Initially elevated on admission, now improved -- Continue Coreg 6.25 mg twice daily   HLD -- LDL 116, HDL 50, LPa pending -- Patiently placed on atorvastatin, reports issues with this in the past.  Switched to Crestor 20 mg daily at discharge   Possible paroxysmal atrial fibrillation -- Reports he was told by EMS previously last year in the setting of palpitations -- No documented episodes noted in the chart, denies ever having been told by physician. -- Does report prior episodes of periodic palpitations, no episodes noted while inpatient.    Tobacco use -- cessation advised   ETOH use -- on CIWA protocol    Pre DM -- Hgb A1c 6.0 (09/2023)  Tobacco use -- Reports he is motivated to quit  General: Well developed, well nourished, male appearing in no acute distress. Head: Normocephalic, atraumatic.  Neck: Supple without bruits, JVD. Lungs:  Resp regular and unlabored, CTA. Heart: RRR, S1, S2, no S3, S4, or murmur; no rub. Abdomen: Soft, non-tender, non-distended with normoactive bowel sounds.  Extremities: No clubbing, cyanosis, edema. Distal pedal pulses are 2+ bilaterally. Right radial cath site stable without bruising or hematoma Neuro: Alert and oriented X 3. Moves all extremities spontaneously. Psych: Normal affect.      Did the patient have an acute coronary syndrome (MI,  NSTEMI, STEMI, etc) this admission?:  Yes                               AHA/ACC ACS Clinical Performance & Quality Measures: Aspirin  prescribed? - Yes ADP Receptor Inhibitor (Plavix/Clopidogrel, Brilinta/Ticagrelor or Effient/Prasugrel) prescribed (includes medically managed patients)? - Yes Beta Blocker prescribed? - Yes High Intensity Statin (Lipitor 40-80mg  or Crestor 20-40mg ) prescribed? - Yes EF assessed during THIS hospitalization? - Yes For EF <40%, was ACEI/ARB prescribed? - Not Applicable (EF >/= 40%) For EF <40%, Aldosterone Antagonist (Spironolactone or Eplerenone) prescribed? - Not Applicable (EF >/= 40%) Cardiac Rehab Phase II ordered (including medically managed patients)? - Yes       The patient will be scheduled for a TOC follow up appointment in 10-14 days.  A message has been sent to the [facility] and Scheduling Pool at the office where the patient should be seen for follow up.  _____________  Discharge Vitals Blood pressure 120/77, pulse 67, temperature 98.9 F (37.2 C), temperature source Oral, resp. rate 16, height 5' 8 (1.727 m), weight 95.3 kg, SpO2 96%.  Filed Weights   11/10/23 1839 11/11/23 0500 11/11/23 1800  Weight: 96 kg 95.9 kg 95.3 kg    Labs & Radiologic Studies  CBC Recent Labs    11/10/23 1311 11/11/23 0815 11/12/23 0345  WBC 7.3 6.6 11.0*  NEUTROABS 3.7  --   --   HGB 16.7 16.9 16.0  HCT 50.5 50.8 47.7  MCV 91.7 91.0 89.0  PLT 198 163 183   Basic Metabolic Panel Recent Labs    84/69/62 1858 11/11/23 0815 11/12/23 0345  NA  --  141 139  K  --  4.5 4.2  CL  --  108 109  CO2  --  20* 22  GLUCOSE  --  92 158*  BUN  --  20 19  CREATININE  --  1.03 1.04  CALCIUM  --  9.1 8.8*  MG 1.9  --  2.1  PHOS 4.2  --   --    Liver Function Tests Recent Labs    11/10/23 1311 11/11/23 0815  AST 19 22  ALT 17 20  ALKPHOS 63 51  BILITOT 0.7 1.0  PROT 6.7 6.2*  ALBUMIN 3.8 3.5   No results for input(s): LIPASE, AMYLASE in the last  72 hours. High Sensitivity Troponin:   Recent Labs  Lab 11/10/23 1311 11/10/23 1419  TROPONINIHS 250* 242*    No results for input(s): TRNPT in the last 720 hours.  BNP Invalid input(s): POCBNP No results for input(s): PROBNP in the last 72 hours.  Recent Labs    11/10/23 1312  BNP 396.3*    D-Dimer Recent Labs    11/10/23 1311  DDIMER <0.27   Hemoglobin A1C No results for input(s): HGBA1C in the last 72 hours. Fasting Lipid Panel No results for input(s): CHOL, HDL, LDLCALC, TRIG, CHOLHDL, LDLDIRECT in the last 72 hours. Lipoprotein (a)  Date/Time Value Ref Range Status  11/11/2023 08:15 AM 115.8 (H) <75.0 nmol/L Final    Comment:    (NOTE) This test was developed and its performance characteristics determined by [facility]. It has not been cleared or approved by the Food and Drug Administration. Note:  Values greater than or equal to 75.0 nmol/L may       indicate an independent risk factor for CHD,       but must be evaluated with caution when applied       to non-Caucasian populations due to the       influence of genetic factors on Lp(a) across       ethnicities. Performed At: [facility] [address] [place], [place] [id number] Pearlean Botts MD MW:[id number]     Thyroid Function Tests No results for input(s): TSH, T4TOTAL, T3FREE, THYROIDAB in the last 72 hours.  Invalid input(s): FREET3 _____________  CT ANGIO CHEST AORTA W/ & OR WO/CM & GATING (HEART & VASCULAR TOWER ONLY) Result Date: 11/11/2023 CLINICAL DATA:  Right coronary artery dissection after catheterization, extending into the right coronary cusp. Assess for involvement of the ascending thoracic aorta. EXAM: CT ANGIOGRAPHY CHEST WITH CONTRAST TECHNIQUE: Multidetector CT imaging of the chest was performed using the standard protocol during bolus administration of intravenous contrast. Multiplanar CT image reconstructions and MIPs were obtained to evaluate the  vascular anatomy. RADIATION DOSE REDUCTION: This exam was performed according to the departmental dose-optimization program which includes automated exposure control, adjustment of the mA and/or kV according to patient size and/or use of iterative reconstruction technique. CONTRAST:  75mL OMNIPAQUE  IOHEXOL  350 MG/ML SOLN COMPARISON:  11/10/2023 FINDINGS: Cardiovascular: Unenhanced imaging of the heart demonstrates calcification of the mitral  and aortic valves. A stent is identified in the proximal right coronary artery beginning at the origin of the vessel. There is high attenuation material seen on the precontrast exam adjacent to the origin of the right coronary artery, along the right coronary cusp at the aortic root, and within the pericardial fat between the ascending aorta and main pulmonary artery. This likely reflects extravasated contrast during the heart catheterization procedure. There is technically adequate evaluation of the thoracic aorta after contrast administration. No evidence of thoracic aortic aneurysm or dissection. There is no evidence of contrast extravasation at the aortic root. Atherosclerosis of the aortic arch, with mild stenosis at the origin of the left subclavian artery approaching 50%. The heart is unremarkable without pericardial effusion. A patent stent is seen within the proximal right coronary artery from its origin, with mild atherosclerosis of the distal right coronary artery. There is mild diffuse atherosclerosis throughout the LAD distribution of the coronary vasculature. The left main coronary artery and circumflex coronary arteries are widely patent. Evaluation of the pulmonary vasculature is limited due to timing of the contrast bolus. Mediastinum/Nodes: No enlarged mediastinal, hilar, or axillary lymph nodes. Thyroid gland, trachea, and esophagus demonstrate no significant findings. Lungs/Pleura: No acute airspace disease, effusion, or pneumothorax. Mild emphysema. Central  airways are patent. Upper Abdomen: Excreted contrast within the left kidney related to previous heart catheterization. Musculoskeletal: No acute or destructive bony abnormalities. Reconstructed images demonstrate no additional findings. Review of the MIP images confirms the above findings. IMPRESSION: 1. High density material adjacent to the origin of the right coronary artery, along the right coronary cusp at the aortic root, and within the pericardial fat between the ascending aorta and main pulmonary artery, likely representing extravasated contrast after dissection of the right coronary artery during preceding heart catheterization procedure. No evidence of additional contrast extravasation on the post contrasted CT images obtained as part of this examination. 2. No evidence of thoracic aortic dissection or aneurysm. 3. Patent right coronary artery stent as above, with mild atherosclerosis within the distal right coronary artery and throughout the left anterior descending coronary artery as above. 4. Aortic Atherosclerosis (ICD10-I70.0) and Emphysema (ICD10-J43.9). Electronically Signed   By: Bobbye Burrow M.D.   On: 11/11/2023 18:04   CARDIAC CATHETERIZATION Result Date: 11/11/2023 1.  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  A total of 4 stents are deployed (3.5 x 28 mm Synergy, 3.5 x 28 mm Synergy, 4.0 x 12 mm Synergy, 4.5 x 12 mm Megatron in the ostium) 2.  Patent left main, LAD, and left circumflex with mild diffuse plaquing but no obstruction 3.  Normal LVEF and normal LVEDP with no regional wall motion abnormalities Recommendations: Gated CTA of the chest to make sure that contrast staining/dissection is localized in the right coronary cusp.  This study will be performed stat.  Otherwise aggressive medical  therapy and continue DAPT with aspirin  and prasugrel.   VAS US  LOWER EXTREMITY VENOUS (DVT) (7a-7p) Result Date: 11/11/2023  Lower Venous DVT Study Patient Name:  Steven Fisher.  Date of Exam:   11/11/2023 Medical Rec #: [id number]          Accession #:    [id number] Date of Birth: 1950-12-31          Patient Gender: M Patient Age:   73 years Exam  Location:  [facility] Procedure:      VAS US  LOWER EXTREMITY VENOUS (DVT) Referring Phys: Donata Fryer MESSICK --------------------------------------------------------------------------------  Indications: Edema.  Comparison Study: No previous exams Performing Technologist: Jody Hill RVT, RDMS  Examination Guidelines: A complete evaluation includes B-mode imaging, spectral Doppler, color Doppler, and power Doppler as needed of all accessible portions of each vessel. Bilateral testing is considered an integral part of a complete examination. Limited examinations for reoccurring indications may be performed as noted. The reflux portion of the exam is performed with the patient in reverse Trendelenburg.  +---------+---------------+---------+-----------+----------+--------------+ RIGHT    CompressibilityPhasicitySpontaneityPropertiesThrombus Aging +---------+---------------+---------+-----------+----------+--------------+ CFV      Full           Yes      Yes                                 +---------+---------------+---------+-----------+----------+--------------+ SFJ      Full                                                        +---------+---------------+---------+-----------+----------+--------------+ FV Prox  Full           Yes      Yes                                 +---------+---------------+---------+-----------+----------+--------------+ FV Mid   Full           Yes      Yes                                 +---------+---------------+---------+-----------+----------+--------------+ FV DistalFull           Yes      Yes                                  +---------+---------------+---------+-----------+----------+--------------+ PFV      Full                                                        +---------+---------------+---------+-----------+----------+--------------+ POP      Full           Yes      Yes                                 +---------+---------------+---------+-----------+----------+--------------+ PTV      Full                                                        +---------+---------------+---------+-----------+----------+--------------+ PERO     Full                                                        +---------+---------------+---------+-----------+----------+--------------+   +---------+---------------+---------+-----------+----------+--------------+  LEFT     CompressibilityPhasicitySpontaneityPropertiesThrombus Aging +---------+---------------+---------+-----------+----------+--------------+ CFV      Full           Yes      Yes                                 +---------+---------------+---------+-----------+----------+--------------+ SFJ      Full                                                        +---------+---------------+---------+-----------+----------+--------------+ FV Prox  Full           Yes      Yes                                 +---------+---------------+---------+-----------+----------+--------------+ FV Mid   Full           Yes      Yes                                 +---------+---------------+---------+-----------+----------+--------------+ FV DistalFull           Yes      Yes                                 +---------+---------------+---------+-----------+----------+--------------+ PFV      Full                                                        +---------+---------------+---------+-----------+----------+--------------+ POP      Full           Yes      Yes                                  +---------+---------------+---------+-----------+----------+--------------+ PTV      Full                                                        +---------+---------------+---------+-----------+----------+--------------+ PERO     Full                                                        +---------+---------------+---------+-----------+----------+--------------+     Summary: BILATERAL: - No evidence of deep vein thrombosis seen in the lower extremities, bilaterally. -No evidence of popliteal cyst, bilaterally.   *See table(s) above for measurements and observations. Electronically signed by Genny Kid MD on 11/11/2023 at 11:54:23 AM.    Final    DG Chest 2 View Result Date: 11/10/2023 CLINICAL DATA:  Short of breath,  chest pain for 1 week EXAM: CHEST - 2 VIEW COMPARISON:  06/13/2013 FINDINGS: Frontal and lateral views of the chest demonstrate an unremarkable cardiac silhouette. No acute airspace disease, effusion, or pneumothorax. No acute bony abnormalities. IMPRESSION: 1. No acute intrathoracic process. Electronically Signed   By: Bobbye Burrow M.D.   On: 11/10/2023 13:04    Disposition Pt is being discharged home today in good condition.  Follow-up Plans & Appointments  Discharge Instructions     Amb Referral to Cardiac Rehabilitation   Complete by: As directed    Diagnosis:  Coronary Stents NSTEMI     After initial evaluation and assessments completed: Virtual Based Care may be provided alone or in conjunction with Phase 2 Cardiac Rehab based on patient barriers.: Yes   Intensive Cardiac Rehabilitation (ICR) [facility] location only OR Traditional Cardiac Rehabilitation (TCR) *If criteria for ICR are not met will enroll in TCR [facility] only): Yes   Call MD for:  difficulty breathing, headache or visual disturbances   Complete by: As directed    Call MD for:  persistant dizziness or light-headedness   Complete by: As directed    Call MD for:  redness, tenderness, or signs of  infection (pain, swelling, redness, odor or green/yellow discharge around incision site)   Complete by: As directed    Diet - low sodium heart healthy   Complete by: As directed    Discharge instructions   Complete by: As directed    Radial Site Care Refer to this sheet in the next few weeks. These instructions provide you with information on caring for yourself after your procedure. Your caregiver may also give you more specific instructions. Your treatment has been planned according to current medical practices, but problems sometimes occur. Call your caregiver if you have any problems or questions after your procedure. HOME CARE INSTRUCTIONS You may shower the day after the procedure. Remove the bandage (dressing) and gently wash the site with plain soap and water. Gently pat the site dry.  Do not apply powder or lotion to the site.  Do not submerge the affected site in water for 3 to 5 days.  Inspect the site at least twice daily.  Do not flex or bend the affected arm for 24 hours.  No lifting over 5 pounds (2.3 kg) for 5 days after your procedure.  Do not drive home if you are discharged the same day of the procedure. Have someone else drive you.  You may drive 24 hours after the procedure unless otherwise instructed by your caregiver.  What to expect: Any bruising will usually fade within 1 to 2 weeks.  Blood that collects in the tissue (hematoma) may be painful to the touch. It should usually decrease in size and tenderness within 1 to 2 weeks.  SEEK IMMEDIATE MEDICAL CARE IF: You have unusual pain at the radial site.  You have redness, warmth, swelling, or pain at the radial site.  You have drainage (other than a small amount of blood on the dressing).  You have chills.  You have a fever or persistent symptoms for more than 72 hours.  You have a fever and your symptoms suddenly get worse.  Your arm becomes pale, cool, tingly, or numb.  You have heavy bleeding from the site. Hold  pressure on the site.   PLEASE DO NOT MISS ANY DOSES OF YOUR EFFIENT!!!!! Also keep a log of you blood pressures and bring back to your follow up appt. Please call the office  with any questions.   Patients taking blood thinners should generally stay away from medicines like ibuprofen, Advil, Motrin, naproxen, and Aleve due to risk of stomach bleeding. You may take Tylenol  as directed or talk to your primary doctor about alternatives.  PLEASE ENSURE THAT YOU DO NOT RUN OUT OF YOUR EFFIENT. This medication is very important to remain on for at least one year. IF you have issues obtaining this medication due to cost please CALL the office 3-5 business days prior to running out in order to prevent missing doses of this medication.   Increase activity slowly   Complete by: As directed        Discharge Medications Allergies as of 11/12/2023       Reactions   Iohexol  Anaphylaxis   Had contrast x 20 years ago, throat closed up   Penicillins Anaphylaxis        Medication List     STOP taking these medications    AMBULATORY NON FORMULARY MEDICATION   hydrochlorothiazide 25 MG tablet Commonly known as: HYDRODIURIL   ibuprofen 800 MG tablet Commonly known as: ADVIL   losartan 100 MG tablet Commonly known as: COZAAR       TAKE these medications    aspirin  EC 81 MG tablet Take 1 tablet (81 mg total) by mouth daily. Swallow whole.   calcium carbonate 500 MG chewable tablet Commonly known as: TUMS - dosed in mg elemental calcium Chew 2-4 tablets by mouth daily.   carvedilol 6.25 MG tablet Commonly known as: COREG Take 1 tablet (6.25 mg total) by mouth 2 (two) times daily with a meal.   FISH OIL OMEGA-3 PO Take 2,800 mg by mouth daily.   LORazepam  1 MG tablet Commonly known as: ATIVAN  Take by mouth.   nitroGLYCERIN 0.4 MG SL tablet Commonly known as: NITROSTAT Place 1 tablet (0.4 mg total) under the tongue every 5 (five) minutes x 3 doses as needed for chest pain.    polyethylene glycol 17 g packet Commonly known as: MiraLax  Take 17 g by mouth daily.   prasugrel 10 MG Tabs tablet Commonly known as: EFFIENT Take 1 tablet (10 mg total) by mouth daily.   rosuvastatin 20 MG tablet Commonly known as: CRESTOR Take 1 tablet (20 mg total) by mouth daily. Start taking on: November 13, 2023         Outstanding Labs/Studies  FLP/LFTs in 4 weeks with consideration of referral to lipid clinic at that time   Echo  Duration of Discharge Encounter: APP Time: 25 minutes   Signed, Johnie Nailer, NP 11/12/2023, 1:49 PM  Patient seen, examined. Available data reviewed. Agree with findings, assessment, and plan as outlined by Johnie Nailer, NP.  Patient is independently interviewed and examined.  He is alert, oriented, no distress.  HEENT is normal, JVP is normal, lungs are clear bilaterally, heart is regular rate and rhythm with 2/6 systolic ejection murmur at the right upper sternal border, abdomen soft nontender, extremities have no edema, right radial cath site is clear with no hematoma or ecchymosis.  Cardiac catheterization findings and treatment plan reviewed with the patient again today.  CT angiography of the chest reviewed and discussed with the patient as well.  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  He was treated with extensive stenting and full coverage of the RCA ostium and remained hemodynamically stable throughout.  CTA of the chest showed localized contrast in the  right coronary cusp adjacent to the right coronary artery, but no extension into the ascending aorta.  The patient has remained hemodynamically stable with no recurrent angina.  He is medically stable for hospital discharge today.  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  I stressed the importance of complete alcohol and tobacco cessation with the patient.  He has been compliant with medication adherence in the past and understands his instructions regarding his medications at discharge.  MD time conducting today's discharge is equal to 30 minutes and includes review of his medications, personal examination of the patient, review of cardiac catheterization and CTA findings and discussion with the patient, and postprocedural instructions including specific discharge instructions discussed with the patient.  Arnoldo Lapping, M.D. 11/12/2023 1:49 PM

## 2023-11-12 NOTE — Progress Notes (Signed)
 CARDIAC REHAB PHASE I   PRE:  Rate/Rhythm: 77 NSR  BP:  Sitting: 120/77      SpO2: 96 RA  MODE:  Ambulation: 470 ft    POST:  Rate/Rhythm: 92 NSR  BP:  Sitting: 153/77      SpO2: 94 RA  Pt amb with supervision assistance in the hallway. Pt asymptomatic during walk, returned to room and educated on NSTEMI  Pt was educated on stent card, stent location, Antiplatelet and ASA use, wt restrictions, no baths/daily wash-ups, s/s of infection, ex guidelines, s/s to stop exercising, NTG use and calling 911, heart healthy diet, risk factors and CRPII. Pt received MI book and materials on exercise, diet, and CRPII. Will refer to [facility].   Pt unsure if he will do CR.   Barkley Li  MS, ACSM-CEP 8:55 AM 11/12/2023    Service time is from 0810 to 0855.

## 2023-11-12 NOTE — Telephone Encounter (Signed)
 1st attempt. Left voicemail to return call to office

## 2023-11-12 NOTE — Discharge Instructions (Signed)
 Information about your medication: Effient (anti-platelet agent)  Generic Name (Brand): prasugrel (Effient), once daily medication  PURPOSE: You are taking this medication along with aspirin to lower your chance of having a heart attack, stroke, or blood clots in your heart stent. These can be fatal. Effient and aspirin help prevent platelets from sticking together and forming a clot that can block an artery or your stent.   Common SIDE EFFECTS you may experience include: bruising or bleeding more easily, shortness of breath  Do not stop taking EFFIENT without talking to the doctor who prescribes it for you. People who are treated with a stent and stop taking Effient too soon, have a higher risk of getting a blood clot in the stent, having a heart attack, or dying. If you stop Effient because of bleeding, or for other reasons, your risk of a heart attack or stroke may increase.   Avoid taking NSAID agents or anti-inflammatory medications such as ibuprofen, naproxen given increased bleed risk with Effient - can use acetaminophen (Tylenol) if needed for pain.  Tell all of your doctors and dentists that you are taking Effient. They should talk to the doctor who prescribed Effient for you before you have any surgery or invasive procedure.   Contact your health care provider if you experience: severe or uncontrollable bleeding, pink/red/Murry urine, vomiting blood or vomit that looks like "coffee grounds", red or black stools (looks like tar), coughing up blood or blood clots ----------------------------------------------------------------------------------------------------------------------

## 2023-11-12 NOTE — Plan of Care (Signed)
 Problem: Education: Goal: Understanding of cardiac disease, CV risk reduction, and recovery process will improve 11/12/2023 1345 by Sunnie England, RN Outcome: Adequate for Discharge 11/12/2023 1126 by Sunnie England, RN Outcome: Adequate for Discharge Goal: Individualized Educational Video(s) 11/12/2023 1345 by Sunnie England, RN Outcome: Adequate for Discharge 11/12/2023 1126 by Sunnie England, RN Outcome: Adequate for Discharge   Problem: Activity: Goal: Ability to tolerate increased activity will improve 11/12/2023 1345 by Sunnie England, RN Outcome: Adequate for Discharge 11/12/2023 1126 by Sunnie England, RN Outcome: Adequate for Discharge   Problem: Cardiac: Goal: Ability to achieve and maintain adequate cardiovascular perfusion will improve 11/12/2023 1345 by Sunnie England, RN Outcome: Adequate for Discharge 11/12/2023 1126 by Sunnie England, RN Outcome: Adequate for Discharge   Problem: Health Behavior/Discharge Planning: Goal: Ability to safely manage health-related needs after discharge will improve 11/12/2023 1345 by Sunnie England, RN Outcome: Adequate for Discharge 11/12/2023 1126 by Sunnie England, RN Outcome: Adequate for Discharge   Problem: Education: Goal: Knowledge of General Education information will improve Description: Including pain rating scale, medication(s)/side effects and non-pharmacologic comfort measures 11/12/2023 1345 by Sunnie England, RN Outcome: Adequate for Discharge 11/12/2023 1126 by Sunnie England, RN Outcome: Adequate for Discharge   Problem: Health Behavior/Discharge Planning: Goal: Ability to manage health-related needs will improve 11/12/2023 1345 by Sunnie England, RN Outcome: Adequate for Discharge 11/12/2023 1126 by Sunnie England, RN Outcome: Adequate for Discharge   Problem: Clinical Measurements: Goal: Ability to maintain clinical measurements within normal limits will  improve 11/12/2023 1345 by Sunnie England, RN Outcome: Adequate for Discharge 11/12/2023 1126 by Sunnie England, RN Outcome: Adequate for Discharge Goal: Will remain free from infection 11/12/2023 1345 by Sunnie England, RN Outcome: Adequate for Discharge 11/12/2023 1126 by Sunnie England, RN Outcome: Adequate for Discharge Goal: Diagnostic test results will improve 11/12/2023 1345 by Sunnie England, RN Outcome: Adequate for Discharge 11/12/2023 1126 by Sunnie England, RN Outcome: Adequate for Discharge Goal: Respiratory complications will improve 11/12/2023 1345 by Sunnie England, RN Outcome: Adequate for Discharge 11/12/2023 1126 by Sunnie England, RN Outcome: Adequate for Discharge Goal: Cardiovascular complication will be avoided 11/12/2023 1345 by Sunnie England, RN Outcome: Adequate for Discharge 11/12/2023 1126 by Sunnie England, RN Outcome: Adequate for Discharge   Problem: Activity: Goal: Risk for activity intolerance will decrease 11/12/2023 1345 by Sunnie England, RN Outcome: Adequate for Discharge 11/12/2023 1126 by Sunnie England, RN Outcome: Adequate for Discharge   Problem: Nutrition: Goal: Adequate nutrition will be maintained 11/12/2023 1345 by Sunnie England, RN Outcome: Adequate for Discharge 11/12/2023 1126 by Sunnie England, RN Outcome: Adequate for Discharge   Problem: Coping: Goal: Level of anxiety will decrease 11/12/2023 1345 by Sunnie England, RN Outcome: Adequate for Discharge 11/12/2023 1126 by Sunnie England, RN Outcome: Adequate for Discharge   Problem: Elimination: Goal: Will not experience complications related to bowel motility 11/12/2023 1345 by Sunnie England, RN Outcome: Adequate for Discharge 11/12/2023 1126 by Sunnie England, RN Outcome: Adequate for Discharge Goal: Will not experience complications related to urinary retention 11/12/2023 1345 by Sunnie England,  RN Outcome: Adequate for Discharge 11/12/2023 1126 by Sunnie England, RN Outcome: Adequate for Discharge   Problem: Pain Managment: Goal: General experience of comfort will improve and/or be controlled 11/12/2023 1345 by Sunnie England, RN Outcome: Adequate for Discharge 11/12/2023 1126 by Sunnie England,  RN Outcome: Adequate for Discharge   Problem: Safety: Goal: Ability to remain free from injury will improve 11/12/2023 1345 by Sunnie England, RN Outcome: Adequate for Discharge 11/12/2023 1126 by Sunnie England, RN Outcome: Adequate for Discharge   Problem: Skin Integrity: Goal: Risk for impaired skin integrity will decrease 11/12/2023 1345 by Sunnie England, RN Outcome: Adequate for Discharge 11/12/2023 1126 by Sunnie England, RN Outcome: Adequate for Discharge   Problem: Education: Goal: Understanding of CV disease, CV risk reduction, and recovery process will improve 11/12/2023 1345 by Sunnie England, RN Outcome: Adequate for Discharge 11/12/2023 1126 by Sunnie England, RN Outcome: Adequate for Discharge Goal: Individualized Educational Video(s) 11/12/2023 1345 by Sunnie England, RN Outcome: Adequate for Discharge 11/12/2023 1126 by Sunnie England, RN Outcome: Adequate for Discharge   Problem: Activity: Goal: Ability to return to baseline activity level will improve 11/12/2023 1345 by Sunnie England, RN Outcome: Adequate for Discharge 11/12/2023 1126 by Sunnie England, RN Outcome: Adequate for Discharge   Problem: Cardiovascular: Goal: Ability to achieve and maintain adequate cardiovascular perfusion will improve 11/12/2023 1345 by Sunnie England, RN Outcome: Adequate for Discharge 11/12/2023 1126 by Sunnie England, RN Outcome: Adequate for Discharge Goal: Vascular access site(s) Level 0-1 will be maintained 11/12/2023 1345 by Sunnie England, RN Outcome: Adequate for Discharge 11/12/2023 1126 by Sunnie England, RN Outcome: Adequate for Discharge   Problem: Health Behavior/Discharge Planning: Goal: Ability to safely manage health-related needs after discharge will improve 11/12/2023 1345 by Sunnie England, RN Outcome: Adequate for Discharge 11/12/2023 1126 by Sunnie England, RN Outcome: Adequate for Discharge

## 2023-11-12 NOTE — Plan of Care (Signed)
  Problem: Education: Goal: Understanding of cardiac disease, CV risk reduction, and recovery process will improve Outcome: Adequate for Discharge Goal: Individualized Educational Video(s) Outcome: Adequate for Discharge   Problem: Activity: Goal: Ability to tolerate increased activity will improve Outcome: Adequate for Discharge   Problem: Cardiac: Goal: Ability to achieve and maintain adequate cardiovascular perfusion will improve Outcome: Adequate for Discharge   Problem: Health Behavior/Discharge Planning: Goal: Ability to safely manage health-related needs after discharge will improve Outcome: Adequate for Discharge   Problem: Education: Goal: Knowledge of General Education information will improve Description: Including pain rating scale, medication(s)/side effects and non-pharmacologic comfort measures Outcome: Adequate for Discharge   Problem: Health Behavior/Discharge Planning: Goal: Ability to manage health-related needs will improve Outcome: Adequate for Discharge   Problem: Clinical Measurements: Goal: Ability to maintain clinical measurements within normal limits will improve Outcome: Adequate for Discharge Goal: Will remain free from infection Outcome: Adequate for Discharge Goal: Diagnostic test results will improve Outcome: Adequate for Discharge Goal: Respiratory complications will improve Outcome: Adequate for Discharge Goal: Cardiovascular complication will be avoided Outcome: Adequate for Discharge   Problem: Activity: Goal: Risk for activity intolerance will decrease Outcome: Adequate for Discharge   Problem: Nutrition: Goal: Adequate nutrition will be maintained Outcome: Adequate for Discharge   Problem: Coping: Goal: Level of anxiety will decrease Outcome: Adequate for Discharge   Problem: Elimination: Goal: Will not experience complications related to bowel motility Outcome: Adequate for Discharge Goal: Will not experience complications  related to urinary retention Outcome: Adequate for Discharge   Problem: Pain Managment: Goal: General experience of comfort will improve and/or be controlled Outcome: Adequate for Discharge   Problem: Safety: Goal: Ability to remain free from injury will improve Outcome: Adequate for Discharge   Problem: Skin Integrity: Goal: Risk for impaired skin integrity will decrease Outcome: Adequate for Discharge   Problem: Education: Goal: Understanding of CV disease, CV risk reduction, and recovery process will improve Outcome: Adequate for Discharge Goal: Individualized Educational Video(s) Outcome: Adequate for Discharge   Problem: Activity: Goal: Ability to return to baseline activity level will improve Outcome: Adequate for Discharge   Problem: Cardiovascular: Goal: Ability to achieve and maintain adequate cardiovascular perfusion will improve Outcome: Adequate for Discharge Goal: Vascular access site(s) Level 0-1 will be maintained Outcome: Adequate for Discharge   Problem: Health Behavior/Discharge Planning: Goal: Ability to safely manage health-related needs after discharge will improve Outcome: Adequate for Discharge

## 2023-11-12 NOTE — Plan of Care (Signed)

## 2023-11-12 NOTE — Progress Notes (Signed)
 Discussed with Cardiology, who will assume primary role for this patient's care during this hospitalization as there is not much else to add from medicine team standpoint.  Please do no hesitate to reach out if needed.  Appreciate assistance.    Rand Burrs MD

## 2023-11-12 NOTE — Telephone Encounter (Signed)
   Transition of Care Follow-up Phone Call Request    Patient Name: Steven Fisher. Date of Birth: [birth year] Date of Encounter: 11/12/2023  Primary Care Provider:  Scarlett Current, PA-C Primary Cardiologist:  Arnoldo Lapping, MD  Steven Fisher. has been scheduled for a transition of care follow up appointment with a [facility] provider:  [name] 6/26  Please reach out to [facility] within 48 hours of discharge to confirm appointment and review transition of care protocol questionnaire. Anticipated discharge date: 6/17  Johnie Nailer, NP  11/12/2023, 1:39 PM

## 2023-11-13 NOTE — Telephone Encounter (Signed)
 Patient contacted regarding discharge from [facility] on 11/12/2023 Patient understands to follow up with provider Palmer Bobo on 11/21/23 at 01:55 at [facility]. Patient understands discharge instructions? Yes Patient understands medications and regiment?Yes Patient understands to bring all medications to this visit? Yes  Ask patient:  Are you enrolled in My Chart Yes

## 2023-11-14 ENCOUNTER — Telehealth (HOSPITAL_COMMUNITY): Payer: Self-pay

## 2023-11-14 NOTE — Telephone Encounter (Signed)
 Pt stated that he is not in the best mental state right now to think about cardiac rehab.    Closed referral.

## 2023-11-20 ENCOUNTER — Encounter (HOSPITAL_BASED_OUTPATIENT_CLINIC_OR_DEPARTMENT_OTHER): Payer: Self-pay | Admitting: *Deleted

## 2023-11-21 ENCOUNTER — Encounter: Payer: Self-pay | Admitting: Emergency Medicine

## 2023-11-21 ENCOUNTER — Ambulatory Visit: Attending: Emergency Medicine | Admitting: Emergency Medicine

## 2023-11-21 VITALS — BP 125/85 | HR 55 | Ht 68.0 in | Wt 207.4 lb

## 2023-11-21 DIAGNOSIS — I214 Non-ST elevation (NSTEMI) myocardial infarction: Secondary | ICD-10-CM | POA: Diagnosis not present

## 2023-11-21 DIAGNOSIS — Z79899 Other long term (current) drug therapy: Secondary | ICD-10-CM | POA: Diagnosis not present

## 2023-11-21 DIAGNOSIS — R011 Cardiac murmur, unspecified: Secondary | ICD-10-CM

## 2023-11-21 DIAGNOSIS — E785 Hyperlipidemia, unspecified: Secondary | ICD-10-CM

## 2023-11-21 DIAGNOSIS — I2510 Atherosclerotic heart disease of native coronary artery without angina pectoris: Secondary | ICD-10-CM | POA: Diagnosis not present

## 2023-11-21 DIAGNOSIS — Z720 Tobacco use: Secondary | ICD-10-CM

## 2023-11-21 LAB — BASIC METABOLIC PANEL WITH GFR

## 2023-11-21 NOTE — Patient Instructions (Signed)
 Medication Instructions:  Your physician recommends that you continue on your current medications as directed. Please refer to the Current Medication list given to you today.  *If you need a refill on your cardiac medications before your next appointment, please call your pharmacy*  Lab Work: BMET, CBC today Fasting Lipid panel, LFTs in 8 weeks If you have labs (blood work) drawn today and your tests are completely normal, you will receive your results only by: MyChart Message (if you have MyChart) OR A paper copy in the mail If you have any lab test that is abnormal or we need to change your treatment, we will call you to review the results.  Testing/Procedures: Echo Your physician has requested that you have an echocardiogram. Echocardiography is a painless test that uses sound waves to create images of your heart. It provides your doctor with information about the size and shape of your heart and how well your heart's chambers and valves are working. This procedure takes approximately one hour. There are no restrictions for this procedure. Please do NOT wear cologne, perfume, aftershave, or lotions (deodorant is allowed). Please arrive 15 minutes prior to your appointment time.  Please note: We ask at that you not bring children with you during ultrasound (echo/ vascular) testing. Due to room size and safety concerns, children are not allowed in the ultrasound rooms during exams. Our front office staff cannot provide observation of children in our lobby area while testing is being conducted. An adult accompanying a patient to their appointment will only be allowed in the ultrasound room at the discretion of the ultrasound technician under special circumstances. We apologize for any inconvenience.   Follow-Up: At [facility], you and your health needs are our priority.  As part of our continuing mission to provide you with exceptional heart care, our providers are all part of one  team.  This team includes your primary Cardiologist (physician) and Advanced Practice Providers or APPs (Physician Assistants and Nurse Practitioners) who all work together to provide you with the care you need, when you need it.  Your next appointment:   3 month(s)  Provider:   Rana, NP  We recommend signing up for the patient portal called MyChart.  Sign up information is provided on this After Visit Summary.  MyChart is used to connect with patients for Virtual Visits (Telemedicine).  Patients are able to view lab/test results, encounter notes, upcoming appointments, etc.  Non-urgent messages can be sent to your provider as well.   To learn more about what you can do with MyChart, go to [email or web address].

## 2023-11-21 NOTE — Progress Notes (Signed)
 Cardiology Office Note:    Date:  11/21/2023  ID:  Steven Lavina Raddle., DOB May 03, 1951, MRN [id number] PCP: Douglass Gerard VEAR DEVONNA  [facility] Providers Cardiologist:  Ozell Fell, MD       Patient Profile:       Chief Complaint: Hospital follow-up post NSTEMI History of Present Illness:  Steven Toomey. is a 73 y.o. male with visit-pertinent history of self-reported atrial fibrillation, cigarette use, hypertension, hypercholesterolemia, prediabetes, alcohol misuse, diverticulosis/diverticulitis, polycythemia, nephrolithiasis, hepatitis C  He was seen in the hospital 11/10/2023 for evaluation of chest pain.  He reported 2 weeks of sudden chest discomfort that felt like an ache in the center of her chest with radiation to jaw, neck, left shoulder.  ECG revealed sinus rhythm with nonspecific T wave abnormality.  High sensitive troponin was 250, 242.  She underwent cardiac catheterization on 6/16 with severe complex calcified stenosis of RCA treated with orbital arthrectomy and extensive stenting requiring DES x 4.  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  Gated CTA of chest showed extravastation beyond site.  Recommendations for DAPT with aspirin /Effient  for at least 1 year and likely longer given degree of stenting of the RCA.  Of note patient was unwilling to have echocardiogram done in the hospital.  His blood pressure was initially elevated on admission she was started on carvedilol  6.25 mg twice daily.  His LDL was 116 he was placed on Crestor .  She did report she was told by EMS previously last year in the setting of his palpitations of possible paroxysmal atrial fibrillation.  There is no document episodes noted in chart and she has never been diagnosed by a physician.  There was no atrial fibrillation on telemetry during admission.   Discussed the use of AI scribe software for clinical note transcription with the patient, who  gave verbal consent to proceed.  History of Present Illness Steven Hinnant. is a 73 year old male with coronary artery disease who presents for follow-up after recent hospitalization for NSTEMI.  Today he is doing well overall.  He is without any acute cardiovascular concerns or complaints this time.  Denies any further anginal symptoms.  Denies any exertional chest pains.  Reports he feels much improved posthospitalization.  Denies any dyspnea, insomnia, PND, leg swelling.  Denies any syncope or presyncope.  He does tell me he was told several years ago by EMS that he was in atrial fibrillation.  As noted above he has no documented history from a physician.  He experiences occasional palpitations, described as heart fluttering, occurring once or twice a year.  Lasting approximately a few seconds at a time.    He is retired, enjoys cooking and watching sports, and is considering returning to golf and starting to walk for exercise.  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  Review of systems:  Please see the history of present illness. All other systems are reviewed and otherwise negative.      Studies Reviewed:    EKG Interpretation Date/Time:  Thursday November 21 2023 14:00:31 EDT Ventricular Rate:  53 PR Interval:  140 QRS Duration:  78 QT Interval:  470 QTC Calculation: 441 R Axis:   47  Text Interpretation: Sinus bradycardia ST & T wave abnormality, consider inferior ischemia ST & T wave abnormality, consider anterolateral ischemia When compared with ECG of 12-Nov-2023 09:12, ST now depressed in Lateral leads T wave inversion more evident in  Anterolateral leads Confirmed by Rana Dixon [phone]) on 11/21/2023 2:24:21 PM    Cardiac catheterization 11/11/2023 1.  Severe complex calcific stenosis of the RCA, treated with orbital atherectomy and extensive stenting in order to treat multiple lesions and residual coronary dissection that extended back into the right coronary cusp  of the aortic root.  A total of 4 stents are deployed (3.5 x 28 mm Synergy, 3.5 x 28 mm Synergy, 4.0 x 12 mm Synergy, 4.5 x 12 mm Megatron in the ostium) 2.  Patent left main, LAD, and left circumflex with mild diffuse plaquing but no obstruction 3.  Normal LVEF and normal LVEDP with no regional wall motion abnormalities   Recommendations: Gated CTA of the chest to make sure that contrast staining/dissection is localized in the right coronary cusp.  This study will be performed stat.  Otherwise aggressive medical therapy and continue DAPT with aspirin  and prasugrel . Diagnostic Dominance: Right  Intervention   Risk Assessment/Calculations:              Physical Exam:   VS:  BP 125/85 (BP Location: Right Arm, Patient Position: Sitting)   Pulse (!) 55   Ht 5' 8 (1.727 m)   Wt 207 lb 6.4 oz (94.1 kg)   SpO2 95%   BMI 31.54 kg/m    Wt Readings from Last 3 Encounters:  11/21/23 207 lb 6.4 oz (94.1 kg)  11/11/23 210 lb 1.6 oz (95.3 kg)  10/16/19 181 lb (82.1 kg)    GEN: Well nourished, well developed in no acute distress NECK: No JVD; No carotid bruits CARDIAC: RRR. 2/6 systolic murmur.  No rubs, gallops RESPIRATORY:  Clear to auscultation without rales, wheezing or rhonchi  ABDOMEN: Soft, non-tender, non-distended EXTREMITIES:  No edema; No acute deformity      Assessment and Plan:  Coronary artery disease NSTEMI LHC 6/16 with severe complex calcified stenosis of RCA treated with orbital arthrectomy and extensive stenting requiring DES x 4.  Complicated by coronary dissection which extended back into the right coronary cusp aortic root.  Gated CT of chest showed extravasated contrast after dissection of right coronary artery but no evidence of additional contrast extravasation beyond site Of note patient was unwilling to stay to have inpatient echo done prior to discharge - EKG today without acute ischemic changes - Right radial site without hematoma or pain.  Healing  appropriately with good pulses - Today patient is without any anginal symptoms.  Has resumed physical activity without exertional symptoms.  No indication for further ischemic evaluation at this time - Continue DAPT with aspirin  81 mg daily and prasugrel  10 mg daily for at least 1 year, likely longer given degree of stenting of RCA - Continue carvedilol  6.25 mg twice daily, rosuvastatin  20 mg daily, nitroglycerin  as needed - Plan for echocardiogram to evaluate LV/RV function and for any valvular abnormalities. Murmur noted on exam today  - BMET and CBC today  Hypertension Blood pressure today well-controlled at 125/85 - Continue carvedilol  6.25 mg twice daily  Hyperlipidemia, LDL goal <55 LDL 116 on 09/2023  Lipoprotein (A) 115.8 on 10/2023 Did not tolerate atorvastatin  in the past, started on rosuvastatin  20 mg daily on admission - Repeat lipid panel & LFTs in 8 weeks  - Continue rosuvastatin  20 mg daily  Possible paroxysmal atrial fibrillation Reports he was told by EMS previously last year in the setting of palpitations. No documented episodes noted in the chart, denies ever having been told by physician - He experiences occasional palpitations,  described as heart fluttering, occurring once or twice a year.  Lasting approximately a few seconds at a time. Has been without episode for approximately 1 year  - He is not interested in heart monitor at this time. Will have him monitor at home with [employer] device  Tobacco use Reports stopped smoking after recent admission - Encourage continued smoking cessation. - Congratulate on successful cessation and reinforce the importance of not resuming smoking     Dispo:  Return in about 3 months (around 02/21/2024).  Signed, Lum LITTIE Louis, NP

## 2023-11-22 ENCOUNTER — Ambulatory Visit: Payer: Self-pay | Admitting: Emergency Medicine

## 2023-11-22 LAB — CBC
Hematocrit: 51 % (ref 37.5–51.0)
Hemoglobin: 17.1 g/dL (ref 13.0–17.7)
MCH: 30.8 pg (ref 26.6–33.0)
MCHC: 33.5 g/dL (ref 31.5–35.7)
MCV: 92 fL (ref 79–97)
Platelets: 240 10*3/uL (ref 150–450)
RBC: 5.55 x10E6/uL (ref 4.14–5.80)
RDW: 13.3 % (ref 11.6–15.4)
WBC: 8.7 10*3/uL (ref 3.4–10.8)

## 2023-11-22 LAB — BASIC METABOLIC PANEL WITH GFR
Calcium: 9.3 mg/dL (ref 8.6–10.2)
Creatinine, Ser: 1.16 mg/dL (ref 0.76–1.27)
Glucose: 93 mg/dL (ref 70–99)
Potassium: 5 mmol/L (ref 3.5–5.2)
Sodium: 140 mmol/L (ref 134–144)
eGFR: 67 mL/min/{1.73_m2} (ref >59)

## 2023-12-26 ENCOUNTER — Other Ambulatory Visit (HOSPITAL_BASED_OUTPATIENT_CLINIC_OR_DEPARTMENT_OTHER)

## 2024-01-06 ENCOUNTER — Ambulatory Visit (HOSPITAL_COMMUNITY)
Admission: RE | Admit: 2024-01-06 | Discharge: 2024-01-06 | Disposition: A | Discharge status: Home / Self Care | Source: Ambulatory Visit | Attending: Emergency Medicine | Admitting: Emergency Medicine

## 2024-01-06 DIAGNOSIS — R011 Cardiac murmur, unspecified: Secondary | ICD-10-CM | POA: Diagnosis present

## 2024-01-06 LAB — ECHOCARDIOGRAM COMPLETE
AR max vel: 1.95 cm2
AV Area VTI: 1.96 cm2
AV Area mean vel: 1.81 cm2
AV Mean grad: 13 mmHg
AV Peak grad: 23.8 mmHg
Ao pk vel: 2.44 m/s
Area-P 1/2: 3.74 cm2
S' Lateral: 3.5 cm

## 2024-01-06 MED ORDER — PERFLUTREN LIPID MICROSPHERE
1.0000 mL | INTRAVENOUS | Status: AC | PRN
  Administered 2024-01-06 (×2): 2 mL via INTRAVENOUS

## 2024-01-13 ENCOUNTER — Other Ambulatory Visit (HOSPITAL_COMMUNITY): Payer: Self-pay

## 2024-01-13 MED ORDER — CARVEDILOL 3.125 MG PO TABS: 3.1250 mg | ORAL_TABLET | Freq: Two times a day (BID) | ORAL | 3 refills | Status: DC

## 2024-01-13 MED ORDER — CARVEDILOL 3.125 MG PO TABS
3.1250 mg | ORAL_TABLET | Freq: Two times a day (BID) | ORAL | 3 refills | Status: DC
  Filled 2024-01-13: qty 180, 90d supply, fill #0

## 2024-01-16 NOTE — Telephone Encounter (Signed)
 Called patient again and left a voice message for patient to call me back. Results will be mailed to patient.

## 2024-01-16 NOTE — Progress Notes (Signed)
 Called patient again and left a voice message for patient to call me back. Results will be mailed to patient.

## 2024-01-28 ENCOUNTER — Encounter: Payer: Self-pay | Admitting: *Deleted

## 2024-01-29 ENCOUNTER — Encounter: Payer: Self-pay | Admitting: Emergency Medicine

## 2024-01-29 ENCOUNTER — Ambulatory Visit: Attending: Emergency Medicine | Admitting: Emergency Medicine

## 2024-01-29 VITALS — BP 116/74 | HR 61 | Ht 68.0 in | Wt 213.0 lb

## 2024-01-29 DIAGNOSIS — Z720 Tobacco use: Secondary | ICD-10-CM

## 2024-01-29 DIAGNOSIS — I214 Non-ST elevation (NSTEMI) myocardial infarction: Secondary | ICD-10-CM | POA: Diagnosis not present

## 2024-01-29 DIAGNOSIS — I340 Nonrheumatic mitral (valve) insufficiency: Secondary | ICD-10-CM

## 2024-01-29 DIAGNOSIS — I10 Essential (primary) hypertension: Secondary | ICD-10-CM

## 2024-01-29 DIAGNOSIS — R7303 Prediabetes: Secondary | ICD-10-CM

## 2024-01-29 DIAGNOSIS — I350 Nonrheumatic aortic (valve) stenosis: Secondary | ICD-10-CM

## 2024-01-29 DIAGNOSIS — I2510 Atherosclerotic heart disease of native coronary artery without angina pectoris: Secondary | ICD-10-CM

## 2024-01-29 DIAGNOSIS — E785 Hyperlipidemia, unspecified: Secondary | ICD-10-CM

## 2024-01-29 MED ORDER — METOPROLOL SUCCINATE ER 25 MG PO TB24: 25.0000 mg | ORAL_TABLET | Freq: Every day | ORAL | 1 refills | Status: AC

## 2024-01-29 NOTE — Patient Instructions (Signed)
 Medication Instructions:  STOP TAKING CARVEDILOL . START TAKING METOPROLOL  SUCCINATE 25 MG DAILY.   Lab Work: NONE TO BE DONE.   Testing/Procedures: NONE   Follow-Up: At [facility], you and your health needs are our priority.  As part of our continuing mission to provide you with exceptional heart care, our providers are all part of one team.  This team includes your primary Cardiologist (physician) and Advanced Practice Providers or APPs (Physician Assistants and Nurse Practitioners) who all work together to provide you with the care you need, when you need it.  Your next appointment:   3 months  Provider:   Ozell Fell, MD OR Lum Louis, NP     Other Instructions: PLEASE TAKE AND RECORD BOTH BLOOD PRESSURE AND HEART RATE EACH DAY, THEN BRING TO YOUR NEXT APPOINTMENT.

## 2024-01-29 NOTE — Progress Notes (Signed)
 Cardiology Office Note:    Date:  01/29/2024  ID:  Steven Fisher., DOB 1951-04-09, MRN [id number] PCP: Douglass Gerard VEAR DEVONNA  [facility] Providers Cardiologist:  Ozell Fell, MD Cardiology APP:  Rana Lum CROME, NP       Patient Profile:       Chief Complaint: 53-month follow-up History of Present Illness:  Steven Fisher. is a 73 y.o. male with visit-pertinent history of self-reported atrial fibrillation, cigarette use, hypertension, hypercholesterolemia, prediabetes, alcohol misuse, diverticulosis/diverticulitis, polycythemia, nephrolithiasis, hepatitis C   He was seen in the hospital 11/10/2023 for evaluation of chest pain.  He reported 2 weeks of sudden chest discomfort that felt like an ache in the center of her chest with radiation to jaw, neck, left shoulder.  ECG revealed sinus rhythm with nonspecific T wave abnormality.  High sensitive troponin was 250, 242.  He underwent cardiac catheterization on 6/16 showing severe complex calcified stenosis of RCA treated with orbital arthrectomy and extensive stenting requiring DES x 4.  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  Gated CTA of chest showed extravastation beyond site.  Recommendations for DAPT with aspirin /Effient  for at least 1 year and likely longer given degree of stenting of the RCA.  Of note patient was unwilling to have echocardiogram done in the hospital.  His blood pressure was initially elevated on admission, he was started on carvedilol  6.25 mg twice daily.  His LDL was 116 he was placed on Crestor .  He did report he was told by EMS several years ago in the setting of his palpitations of possible paroxysmal atrial fibrillation.  There is no document episodes noted in chart and he has never been diagnosed by a physician.  There was no atrial fibrillation on telemetry during admission.  He was last seen for follow-up in office on 11/21/2023.  He was doing well  without chest pains and denied any further anginal symptoms.  Feels much improved posthospitalization.  Blood pressure was well-controlled at 125/85.  His medication management was continued.  On 01/06/2024 patient noted he feels like the carvedilol  makes him tired and spacey.  His carvedilol  was subsequently decreased to 3.125 mg twice daily.  Echocardiogram 01/08/2024 showed LVEF 70 to 75%, no RWMA, grade 1 DD, RV function and size normal, normal PASP, mild mitral valve regurgitation, mild aortic valve stenosis with mean gradient 13 mmHg.   Discussed the use of AI scribe software for clinical note transcription with the patient, who gave verbal consent to proceed.  History of Present Illness Steven Fisher. is a 73 year old male with coronary artery disease who presents for a three-month follow-up.  Today patient is doing well.  He is without any chest pains, exertional symptoms, or prior anginal equivalent.  He reports side effects from carvedilol , including fatigue and dizziness, which improved after halving the dose but still persist. Eating more before taking the medication helps. Blood pressure readings are low, around 110s over 70s mmHg, at home with lightheadedness and dizziness post-medication. He recently resumed smoking after quitting for a month, acknowledging its impact on heart health.  He is without orthopnea, PND, leg swelling, lightheadedness, dizziness, syncope, presyncope  Review of systems:  Please see the history of present illness. All other systems are reviewed and otherwise negative.      Studies Reviewed:    EKG Interpretation Date/Time:  Wednesday January 29 2024 11:11:23 EDT Ventricular Rate:  65 PR Interval:  132 QRS Duration:  84 QT Interval:  402 QTC Calculation: 418 R Axis:   87  Text Interpretation: Sinus rhythm with marked sinus arrhythmia ST & T wave abnormality, consider lateral ischemia When compared with ECG of 21-Nov-2023 14:00, T wave inversion  less evident in Anterior leads Confirmed by Rana Dixon [phone]) on 01/29/2024 12:09:56 PM    Echocardiogram 01/06/2024 1. Left ventricular ejection fraction, by estimation, is 70 to 75%. The  left ventricle has hyperdynamic function. The left ventricle has no  regional wall motion abnormalities. Left ventricular diastolic parameters  are consistent with Grade I diastolic  dysfunction (impaired relaxation).   2. Right ventricular systolic function is normal. The right ventricular  size is normal. There is normal pulmonary artery systolic pressure. The  estimated right ventricular systolic pressure is 33.0 mmHg.   3. The mitral valve is normal in structure. Mild mitral valve  regurgitation. No evidence of mitral stenosis.   4. DVI 0.43. The aortic valve has an indeterminant number of cusps. There  is moderate calcification of the aortic valve. There is moderate  thickening of the aortic valve. Aortic valve regurgitation is not  visualized. Mild aortic valve stenosis. Aortic  valve mean gradient measures 13.0 mmHg. Aortic valve Vmax measures 2.44  m/s.   5. The inferior vena cava is normal in size with greater than 50%  respiratory variability, suggesting right atrial pressure of 3 mmHg.   Cardiac catheterization 11/11/2023 1.  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  A total of 4 stents are deployed (3.5 x 28 mm Synergy, 3.5 x 28 mm Synergy, 4.0 x 12 mm Synergy, 4.5 x 12 mm Megatron in the ostium) 2.  Patent left main, LAD, and left circumflex with mild diffuse plaquing but no obstruction 3.  Normal LVEF and normal LVEDP with no regional wall motion abnormalities   Recommendations: Gated CTA of the chest to make sure that contrast staining/dissection is localized in the right coronary cusp.  This  study will be performed stat.  Otherwise aggressive medical therapy and continue DAPT with aspirin  and prasugrel . Diagnostic Dominance: Right  Intervention     Risk Assessment/Calculations:              Physical Exam:   VS:  BP 116/74 (BP Location: Left Arm, Patient Position: Sitting, Cuff Size: Normal)   Pulse 61   Ht 5' 8 (1.727 m)   Wt 213 lb (96.6 kg)   SpO2 98%   BMI 32.39 kg/m    Wt Readings from Last 3 Encounters:  01/29/24 213 lb (96.6 kg)  11/21/23 207 lb 6.4 oz (94.1 kg)  11/11/23 210 lb 1.6 oz (95.3 kg)    GEN: Well nourished, well developed in no acute distress NECK: No JVD; No carotid bruits CARDIAC: RRR, no murmurs, rubs, gallops RESPIRATORY:  Clear to auscultation without rales, wheezing or rhonchi  ABDOMEN: Soft, non-tender, non-distended EXTREMITIES:  No edema; No acute deformity      Assessment and Plan:  Coronary artery disease NSTEMI LHC 6/16 with severe complex calcified stenosis of RCA treated with orbital arthrectomy and extensive stenting requiring DES x 4.  Complicated by coronary dissection which extended back into the right coronary cusp aortic root.  Gated CT of chest showed extravasated contrast after dissection of right coronary artery but no evidence of additional contrast extravasation beyond site  Echocardiogram 12/2023 with LVEF 70-75%, no RWMA, grade 1 DD - EKG today without acute ischemic change - Today patient is without any anginal symptoms.  Remains without exertional symptoms.  Denies prior anginal equivalent.  Remains adherent to medication regimen.  No indication for further ischemic evaluation at this time - Continue DAPT with aspirin  81 mg daily and prasugrel  10 mg daily for at least 1 year, likely longer given degree of stenting of RCA - Continue rosuvastatin  20 mg daily, metoprolol  XL 25 mg daily, nitroglycerin  as needed  Hypertension Blood pressure today well-controlled at 116/74 Carvedilol  recently decreased from 6.25 BID to  3.125 BID on 8/11 due to dizziness/lightheadedness.  After change, symptoms improved but still persist - Patient reports home BPs average 110s / 70s prior to medication administration - Plan to switch carvedilol  to metoprolol  XL 25 mg daily as he is likely experiencing hypotension - Start metoprolol  XL 25 mg daily   Hyperlipidemia, LDL goal <55 LDL 41 on 12/2023 and well-controlled Lipoprotein (A) 115.8  - Continue rosuvastatin  20 mg daily, denies myalgias   Possible paroxysmal atrial fibrillation Reports he was told by EMS last year in the setting of palpitations. No documented episodes noted in the chart, denies ever having been told by physician - He experiences occasional palpitations, described as heart fluttering, occurring once or twice a year.  Lasting approximately a few seconds at a time. Has been without episode for approximately 1 year  - He is not interested in heart monitor at this time. Will have him monitor at home with [facility] device - He remains in normal sinus rhythm today   Tobacco use Reports stopped smoking after recent admission for 1 month however picked up smoking once again at 1 pack/day - Encouraged smoking cessation  Mild aortic valve stenosis Mild mitral valve regurgitation Echocardiogram 12/2023 showed mild aortic valve stenosis with mean gradient 13 mmHg and mild mitral valve regurgitation - Can repeat echocardiogram x 1 year for routine monitoring - Today he remains asymptomatic, no intervention warranted at this time  Prediabetes A1c 5.9% on 12/2023 - Recommend DASH diet (high in vegetables, fruits, low-fat dairy products, whole grains, poultry, fish, and nuts and low in sweets, sugar-sweetened beverages, and red meats), salt restriction and increase physical activity.       Dispo:  Return in about 3 months (around 04/29/2024).  Signed, Lum LITTIE Louis, NP

## 2024-02-07 ENCOUNTER — Other Ambulatory Visit (HOSPITAL_COMMUNITY): Payer: Self-pay

## 2024-05-12 ENCOUNTER — Encounter: Payer: Self-pay | Admitting: Emergency Medicine

## 2024-05-12 ENCOUNTER — Ambulatory Visit: Admitting: Emergency Medicine

## 2024-05-12 VITALS — BP 134/80 | HR 59 | Ht 68.0 in | Wt 224.0 lb

## 2024-05-12 DIAGNOSIS — I10 Essential (primary) hypertension: Secondary | ICD-10-CM

## 2024-05-12 DIAGNOSIS — E785 Hyperlipidemia, unspecified: Secondary | ICD-10-CM

## 2024-05-12 DIAGNOSIS — Z720 Tobacco use: Secondary | ICD-10-CM

## 2024-05-12 DIAGNOSIS — R7303 Prediabetes: Secondary | ICD-10-CM

## 2024-05-12 DIAGNOSIS — I2510 Atherosclerotic heart disease of native coronary artery without angina pectoris: Secondary | ICD-10-CM

## 2024-05-12 DIAGNOSIS — I350 Nonrheumatic aortic (valve) stenosis: Secondary | ICD-10-CM

## 2024-05-12 DIAGNOSIS — I340 Nonrheumatic mitral (valve) insufficiency: Secondary | ICD-10-CM

## 2024-05-12 NOTE — Patient Instructions (Addendum)
 Medication Instructions:  NO CHANGES  Lab Work: NONE TO BE DONE TODAY.  Testing/Procedures: NONE  Follow-Up: At [facility], you and your health needs are our priority.  As part of our continuing mission to provide you with exceptional heart care, our providers are all part of one team.  This team includes your primary Cardiologist (physician) and Advanced Practice Providers or APPs (Physician Assistants and Nurse Practitioners) who all work together to provide you with the care you need, when you need it.  Your next appointment:   6 MONTHS  Provider:   Ozell Fell, MD OR Lum Louis, NP

## 2024-05-12 NOTE — Progress Notes (Signed)
 Cardiology Office Note:    Date:  05/12/2024  ID:  Steven Lavina Raddle., DOB [birth year], MRN [id number] PCP: Douglass Gerard VEAR DEVONNA  [facility] Providers Cardiologist:  Ozell Fell, MD Cardiology APP:  Rana Lum CROME, NP       Patient Profile:       Chief Complaint: 20-month follow-up History of Present Illness:  Steven Bodie. is a 73 y.o. male with visit-pertinent history of self-reported atrial fibrillation, cigarette use, hypertension, hypercholesterolemia, prediabetes, alcohol misuse, diverticulosis/diverticulitis, polycythemia, nephrolithiasis, hepatitis C, coronary artery disease, NSTEMI, aortic valve stenosis, mitral valve regurgitation   He was seen in the hospital 11/10/2023 for evaluation of chest pain.  He reported 2 weeks of sudden chest discomfort that felt like an ache in the center of her chest with radiation to jaw, neck, left shoulder.  ECG revealed sinus rhythm with nonspecific T wave abnormality.  High sensitive troponin was 250, 242.  He underwent cardiac catheterization on 6/16 showing severe complex calcified stenosis of RCA treated with orbital arthrectomy and extensive stenting requiring DES x 4.  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  Gated CTA of chest showed extravastation beyond site.  Recommendations for DAPT with aspirin /Effient  for at least 1 year and likely longer given degree of stenting of the RCA.  Of note patient was unwilling to have echocardiogram done in the hospital.  His blood pressure was initially elevated on admission, he was started on carvedilol  6.25 mg twice daily.  His LDL was 116 he was placed on Crestor .  He did report he was told by EMS several years ago in the setting of his palpitations of possible paroxysmal atrial fibrillation.  There is no document episodes noted in chart and he has never been diagnosed by a physician.  There was no atrial fibrillation on telemetry during  admission.   Seen follow-up in office on 11/21/2023.  He was doing well without chest pains and denied any further anginal symptoms.  Feels much improved posthospitalization.  Blood pressure was well-controlled at 125/85.  His medication management was continued.   Echocardiogram 01/08/2024 showed LVEF 70 to 75%, no RWMA, grade 1 DD, RV function and size normal, normal PASP, mild mitral valve regurgitation, mild aortic valve stenosis with mean gradient 13 mmHg.  Last seen in clinic on 01/29/2024.  Reported dizziness/lightheadedness on carvedilol .  He was switched to metoprolol  XL 25 mg daily.  He was doing well without anginal symptoms.   Discussed the use of AI scribe software for clinical note transcription with the patient, who gave verbal consent to proceed.  History of Present Illness Steven Dewalt. is a 73 year old male with coronary artery disease who presents for a follow-up visit.  Today patient is doing well without acute cardiovascular concerns.  He takes aspirin , metoprolol , prasugrel , and rosuvastatin  daily.  He had to stop his prasugrel , metoprolol , rosuvastatin  for about 2 weeks due to diarrhea but has now resumed his medicines without issue.  He no longer has lightheadedness or dizziness after stopping carvedilol , which also had caused fatigue and somnolence. His blood pressure averages 130/75 mmHg at home. He has no chest pain or palpitations.  He denies prior anginal equivalent.  He has chronic ankle edema.  He does not use compression socks, spends most of the day sitting, and elevates his legs.  He smokes intermittently and would like to quit.  He knows he has prediabetes with an A1c of 5.9%. His diet includes  frequent peanut butter and jelly sandwiches and chocolate milk. He has irregular sleep patterns, often sleeping through the morning and waking around noon.  He frequently eats around 1 to 2 AM at nighttime.  He denies syncope, presyncope, melena, hematochezia,  orthopnea, PND   Review of systems:  Please see the history of present illness. All other systems are reviewed and otherwise negative.      Studies Reviewed:        Echocardiogram 01/06/2024 1. Left ventricular ejection fraction, by estimation, is 70 to 75%. The  left ventricle has hyperdynamic function. The left ventricle has no  regional wall motion abnormalities. Left ventricular diastolic parameters  are consistent with Grade I diastolic  dysfunction (impaired relaxation).   2. Right ventricular systolic function is normal. The right ventricular  size is normal. There is normal pulmonary artery systolic pressure. The  estimated right ventricular systolic pressure is 33.0 mmHg.   3. The mitral valve is normal in structure. Mild mitral valve  regurgitation. No evidence of mitral stenosis.   4. DVI 0.43. The aortic valve has an indeterminant number of cusps. There  is moderate calcification of the aortic valve. There is moderate  thickening of the aortic valve. Aortic valve regurgitation is not  visualized. Mild aortic valve stenosis. Aortic  valve mean gradient measures 13.0 mmHg. Aortic valve Vmax measures 2.44  m/s.   5. The inferior vena cava is normal in size with greater than 50%  respiratory variability, suggesting right atrial pressure of 3 mmHg.    Cardiac catheterization 11/11/2023 1.  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  A total of 4 stents are deployed (3.5 x 28 mm Synergy, 3.5 x 28 mm Synergy, 4.0 x 12 mm Synergy, 4.5 x 12 mm Megatron in the ostium) 2.  Patent left main, LAD, and left circumflex with mild diffuse plaquing but no obstruction 3.  Normal LVEF and normal LVEDP with no regional wall motion abnormalities   Recommendations: Gated CTA of the chest to make sure  that contrast staining/dissection is localized in the right coronary cusp.  This study will be performed stat.  Otherwise aggressive medical therapy and continue DAPT with aspirin  and prasugrel . Diagnostic Dominance: Right  Intervention     Risk Assessment/Calculations:              Physical Exam:   VS:  BP 134/80 (BP Location: Right Arm, Patient Position: Sitting, Cuff Size: Normal)   Pulse (!) 59   Ht 5' 8 (1.727 m)   Wt 224 lb (101.6 kg)   BMI 34.06 kg/m    Wt Readings from Last 3 Encounters:  05/12/24 224 lb (101.6 kg)  01/29/24 213 lb (96.6 kg)  11/21/23 207 lb 6.4 oz (94.1 kg)    GEN: Well nourished, well developed in no acute distress NECK: No JVD; No carotid bruits CARDIAC: RRR, 2/6 systolic murmur, rubs, gallops RESPIRATORY:  Clear to auscultation without rales, wheezing or rhonchi  ABDOMEN: Soft, non-tender, non-distended EXTREMITIES: Mild pedal edema bilaterally; No acute deformity      Assessment and Plan:  Coronary artery disease NSTEMI LHC 6/16 with severe complex calcified stenosis of RCA treated with orbital arthrectomy and extensive stenting requiring DES x 4.  Complicated by coronary dissection which extended back into the right coronary cusp aortic root.  Gated CT of  chest showed extravasated contrast after dissection of right coronary artery but no evidence of additional contrast extravasation beyond site Echocardiogram 12/2023 with LVEF 70-75%, no RWMA, grade 1 DD - Today patient is stable without anginal symptoms.  He denies any exertional symptoms.  Denies prior anginal equivalent. No indication for further ischemic evaluation at this time - Continue DAPT with aspirin  81 mg daily and prasugrel  10 mg daily for at least 1 year, likely longer given degree of stenting of RCA - Continue rosuvastatin  20 mg daily, metoprolol  XL 25 mg daily, and nitroglycerin  as needed   Hypertension Blood pressure today well-controlled at 134/80 Carvedilol  discontinued in  the past due to fatigue, lightheadedness, and dizziness - Continue metoprolol  XL 25 mg daily   Hyperlipidemia, LDL goal <55 LDL 41 on 12/2023 and well-controlled Lipoprotein (A) 115.8  - Continue rosuvastatin  20 mg daily, denies myalgias   Possible paroxysmal atrial fibrillation (self-reported) Reports he was told by EMS in early 2024 in the setting of palpitations. No documented episodes noted in the chart, denies ever having been told by a physician - Today he denies any palpitations.  Reports palpitations will only occur maybe once out of the year - He is not interested in ZIO monitoring.  Will have him monitor at home with [facility] device - He remains in normal sinus rhythm today per auscultation   Tobacco use Continues to smoke 1 pack/day - Encouraged smoking cessation   Mild aortic valve stenosis Mild mitral valve regurgitation Echocardiogram 12/2023 showed mild aortic valve stenosis with mean gradient 13 mmHg and mild mitral valve regurgitation - Can repeat echocardiogram x 1 year for routine monitoring - Today he remains asymptomatic, no intervention warranted at this time   Prediabetes A1c 5.9% on 12/2023 - Recommended Mediterranean diet and daily physical activity      Dispo:  Return in about 6 months (around 11/10/2024).  Signed, Lum LITTIE Louis, NP

## 2024-06-13 ENCOUNTER — Other Ambulatory Visit (HOSPITAL_BASED_OUTPATIENT_CLINIC_OR_DEPARTMENT_OTHER): Payer: Self-pay
